# Patient Record
Sex: Male | Born: 1960 | Race: Black or African American | Hispanic: No | Marital: Single | Smoking: Never smoker
Health system: Southern US, Community
[De-identification: ages and names within clinical notes are randomized; demographics above are authoritative.]

## PROBLEM LIST (undated history)

## (undated) DIAGNOSIS — K219 Gastro-esophageal reflux disease without esophagitis: Secondary | ICD-10-CM

## (undated) DIAGNOSIS — C61 Malignant neoplasm of prostate: Secondary | ICD-10-CM

## (undated) DIAGNOSIS — E7800 Pure hypercholesterolemia, unspecified: Secondary | ICD-10-CM

## (undated) DIAGNOSIS — M1990 Unspecified osteoarthritis, unspecified site: Secondary | ICD-10-CM

## (undated) DIAGNOSIS — I10 Essential (primary) hypertension: Secondary | ICD-10-CM

## (undated) HISTORY — PX: NO PAST SURGERIES: SHX2092

---

## 2012-12-29 ENCOUNTER — Ambulatory Visit: Payer: Medicare PPO | Attending: Internal Medicine | Admitting: Physical Therapy

## 2012-12-29 DIAGNOSIS — M542 Cervicalgia: Secondary | ICD-10-CM | POA: Insufficient documentation

## 2012-12-29 DIAGNOSIS — IMO0001 Reserved for inherently not codable concepts without codable children: Secondary | ICD-10-CM | POA: Insufficient documentation

## 2012-12-29 DIAGNOSIS — M545 Low back pain, unspecified: Secondary | ICD-10-CM | POA: Insufficient documentation

## 2012-12-29 DIAGNOSIS — M6281 Muscle weakness (generalized): Secondary | ICD-10-CM | POA: Insufficient documentation

## 2013-01-05 ENCOUNTER — Ambulatory Visit: Payer: No Typology Code available for payment source | Attending: Internal Medicine | Admitting: Physical Therapy

## 2013-01-05 DIAGNOSIS — M545 Low back pain, unspecified: Secondary | ICD-10-CM | POA: Insufficient documentation

## 2013-01-05 DIAGNOSIS — M542 Cervicalgia: Secondary | ICD-10-CM | POA: Insufficient documentation

## 2013-01-05 DIAGNOSIS — IMO0001 Reserved for inherently not codable concepts without codable children: Secondary | ICD-10-CM | POA: Insufficient documentation

## 2013-01-05 DIAGNOSIS — M6281 Muscle weakness (generalized): Secondary | ICD-10-CM | POA: Insufficient documentation

## 2013-01-12 ENCOUNTER — Ambulatory Visit: Payer: No Typology Code available for payment source | Admitting: Rehabilitation

## 2013-01-16 ENCOUNTER — Ambulatory Visit: Payer: No Typology Code available for payment source | Admitting: Physical Therapy

## 2013-01-20 ENCOUNTER — Ambulatory Visit: Payer: No Typology Code available for payment source | Admitting: Rehabilitation

## 2013-01-23 ENCOUNTER — Ambulatory Visit: Payer: No Typology Code available for payment source | Admitting: Rehabilitation

## 2013-01-26 ENCOUNTER — Ambulatory Visit: Payer: No Typology Code available for payment source | Admitting: Physical Therapy

## 2013-01-31 ENCOUNTER — Ambulatory Visit: Payer: No Typology Code available for payment source | Attending: Internal Medicine | Admitting: Physical Therapy

## 2013-01-31 DIAGNOSIS — M545 Low back pain, unspecified: Secondary | ICD-10-CM | POA: Insufficient documentation

## 2013-01-31 DIAGNOSIS — IMO0001 Reserved for inherently not codable concepts without codable children: Secondary | ICD-10-CM | POA: Insufficient documentation

## 2013-01-31 DIAGNOSIS — M6281 Muscle weakness (generalized): Secondary | ICD-10-CM | POA: Diagnosis not present

## 2013-01-31 DIAGNOSIS — M542 Cervicalgia: Secondary | ICD-10-CM | POA: Insufficient documentation

## 2013-02-01 ENCOUNTER — Ambulatory Visit: Payer: No Typology Code available for payment source | Admitting: Physical Therapy

## 2013-02-03 ENCOUNTER — Ambulatory Visit: Payer: No Typology Code available for payment source | Admitting: Physical Therapy

## 2013-02-03 DIAGNOSIS — IMO0001 Reserved for inherently not codable concepts without codable children: Secondary | ICD-10-CM | POA: Diagnosis not present

## 2013-02-06 ENCOUNTER — Ambulatory Visit: Payer: No Typology Code available for payment source | Admitting: Rehabilitation

## 2013-02-06 ENCOUNTER — Ambulatory Visit: Payer: No Typology Code available for payment source | Admitting: Physical Therapy

## 2013-02-06 DIAGNOSIS — IMO0001 Reserved for inherently not codable concepts without codable children: Secondary | ICD-10-CM | POA: Diagnosis not present

## 2013-02-07 ENCOUNTER — Ambulatory Visit: Payer: No Typology Code available for payment source | Admitting: Physical Therapy

## 2013-02-07 DIAGNOSIS — IMO0001 Reserved for inherently not codable concepts without codable children: Secondary | ICD-10-CM | POA: Diagnosis not present

## 2013-02-09 ENCOUNTER — Encounter: Payer: 59 | Admitting: Rehabilitation

## 2015-06-10 DIAGNOSIS — M791 Myalgia: Secondary | ICD-10-CM | POA: Diagnosis not present

## 2015-06-10 DIAGNOSIS — G8929 Other chronic pain: Secondary | ICD-10-CM | POA: Diagnosis not present

## 2015-06-10 DIAGNOSIS — Z79899 Other long term (current) drug therapy: Secondary | ICD-10-CM | POA: Diagnosis not present

## 2015-06-10 DIAGNOSIS — M545 Low back pain: Secondary | ICD-10-CM | POA: Diagnosis not present

## 2015-06-30 DIAGNOSIS — M542 Cervicalgia: Secondary | ICD-10-CM | POA: Diagnosis not present

## 2015-07-08 DIAGNOSIS — M170 Bilateral primary osteoarthritis of knee: Secondary | ICD-10-CM | POA: Diagnosis not present

## 2015-07-08 DIAGNOSIS — J45909 Unspecified asthma, uncomplicated: Secondary | ICD-10-CM | POA: Diagnosis not present

## 2015-07-08 DIAGNOSIS — K219 Gastro-esophageal reflux disease without esophagitis: Secondary | ICD-10-CM | POA: Diagnosis not present

## 2015-07-08 DIAGNOSIS — E785 Hyperlipidemia, unspecified: Secondary | ICD-10-CM | POA: Diagnosis not present

## 2015-07-08 DIAGNOSIS — Z0100 Encounter for examination of eyes and vision without abnormal findings: Secondary | ICD-10-CM | POA: Diagnosis not present

## 2015-07-08 DIAGNOSIS — Z131 Encounter for screening for diabetes mellitus: Secondary | ICD-10-CM | POA: Diagnosis not present

## 2015-07-08 DIAGNOSIS — Z0000 Encounter for general adult medical examination without abnormal findings: Secondary | ICD-10-CM | POA: Diagnosis not present

## 2015-07-08 DIAGNOSIS — I10 Essential (primary) hypertension: Secondary | ICD-10-CM | POA: Diagnosis not present

## 2015-07-08 DIAGNOSIS — Z01118 Encounter for examination of ears and hearing with other abnormal findings: Secondary | ICD-10-CM | POA: Diagnosis not present

## 2015-07-10 DIAGNOSIS — G8929 Other chronic pain: Secondary | ICD-10-CM | POA: Diagnosis not present

## 2015-07-10 DIAGNOSIS — M545 Low back pain: Secondary | ICD-10-CM | POA: Diagnosis not present

## 2015-07-10 DIAGNOSIS — M791 Myalgia: Secondary | ICD-10-CM | POA: Diagnosis not present

## 2015-07-10 DIAGNOSIS — Z79899 Other long term (current) drug therapy: Secondary | ICD-10-CM | POA: Diagnosis not present

## 2015-07-30 DIAGNOSIS — M542 Cervicalgia: Secondary | ICD-10-CM | POA: Diagnosis not present

## 2015-08-06 DIAGNOSIS — M545 Low back pain: Secondary | ICD-10-CM | POA: Diagnosis not present

## 2015-08-06 DIAGNOSIS — M791 Myalgia: Secondary | ICD-10-CM | POA: Diagnosis not present

## 2015-08-06 DIAGNOSIS — Z79899 Other long term (current) drug therapy: Secondary | ICD-10-CM | POA: Diagnosis not present

## 2015-08-06 DIAGNOSIS — G8929 Other chronic pain: Secondary | ICD-10-CM | POA: Diagnosis not present

## 2015-08-07 DIAGNOSIS — R0989 Other specified symptoms and signs involving the circulatory and respiratory systems: Secondary | ICD-10-CM | POA: Diagnosis not present

## 2015-08-07 DIAGNOSIS — R200 Anesthesia of skin: Secondary | ICD-10-CM | POA: Diagnosis not present

## 2015-08-07 DIAGNOSIS — E785 Hyperlipidemia, unspecified: Secondary | ICD-10-CM | POA: Diagnosis not present

## 2015-08-07 DIAGNOSIS — M79609 Pain in unspecified limb: Secondary | ICD-10-CM | POA: Diagnosis not present

## 2015-08-19 DIAGNOSIS — J45909 Unspecified asthma, uncomplicated: Secondary | ICD-10-CM | POA: Diagnosis not present

## 2015-08-19 DIAGNOSIS — I10 Essential (primary) hypertension: Secondary | ICD-10-CM | POA: Diagnosis not present

## 2015-08-19 DIAGNOSIS — E559 Vitamin D deficiency, unspecified: Secondary | ICD-10-CM | POA: Diagnosis not present

## 2015-08-19 DIAGNOSIS — R7303 Prediabetes: Secondary | ICD-10-CM | POA: Diagnosis not present

## 2015-08-19 DIAGNOSIS — E785 Hyperlipidemia, unspecified: Secondary | ICD-10-CM | POA: Diagnosis not present

## 2015-08-19 DIAGNOSIS — K219 Gastro-esophageal reflux disease without esophagitis: Secondary | ICD-10-CM | POA: Diagnosis not present

## 2015-08-19 DIAGNOSIS — F419 Anxiety disorder, unspecified: Secondary | ICD-10-CM | POA: Diagnosis not present

## 2015-08-19 DIAGNOSIS — M170 Bilateral primary osteoarthritis of knee: Secondary | ICD-10-CM | POA: Diagnosis not present

## 2015-08-28 DIAGNOSIS — M542 Cervicalgia: Secondary | ICD-10-CM | POA: Diagnosis not present

## 2015-09-06 DIAGNOSIS — Z79899 Other long term (current) drug therapy: Secondary | ICD-10-CM | POA: Diagnosis not present

## 2015-09-06 DIAGNOSIS — M545 Low back pain: Secondary | ICD-10-CM | POA: Diagnosis not present

## 2015-09-06 DIAGNOSIS — M791 Myalgia: Secondary | ICD-10-CM | POA: Diagnosis not present

## 2015-09-06 DIAGNOSIS — G8929 Other chronic pain: Secondary | ICD-10-CM | POA: Diagnosis not present

## 2015-09-28 DIAGNOSIS — M542 Cervicalgia: Secondary | ICD-10-CM | POA: Diagnosis not present

## 2015-10-04 DIAGNOSIS — M791 Myalgia: Secondary | ICD-10-CM | POA: Diagnosis not present

## 2015-10-04 DIAGNOSIS — Z79899 Other long term (current) drug therapy: Secondary | ICD-10-CM | POA: Diagnosis not present

## 2015-10-04 DIAGNOSIS — G8929 Other chronic pain: Secondary | ICD-10-CM | POA: Diagnosis not present

## 2015-10-04 DIAGNOSIS — M545 Low back pain: Secondary | ICD-10-CM | POA: Diagnosis not present

## 2015-10-28 DIAGNOSIS — M542 Cervicalgia: Secondary | ICD-10-CM | POA: Diagnosis not present

## 2015-11-04 DIAGNOSIS — M791 Myalgia: Secondary | ICD-10-CM | POA: Diagnosis not present

## 2015-11-04 DIAGNOSIS — Z79899 Other long term (current) drug therapy: Secondary | ICD-10-CM | POA: Diagnosis not present

## 2015-11-04 DIAGNOSIS — M545 Low back pain: Secondary | ICD-10-CM | POA: Diagnosis not present

## 2015-11-04 DIAGNOSIS — G8929 Other chronic pain: Secondary | ICD-10-CM | POA: Diagnosis not present

## 2015-11-18 DIAGNOSIS — M170 Bilateral primary osteoarthritis of knee: Secondary | ICD-10-CM | POA: Diagnosis not present

## 2015-11-18 DIAGNOSIS — F419 Anxiety disorder, unspecified: Secondary | ICD-10-CM | POA: Diagnosis not present

## 2015-11-18 DIAGNOSIS — R7303 Prediabetes: Secondary | ICD-10-CM | POA: Diagnosis not present

## 2015-11-18 DIAGNOSIS — J45909 Unspecified asthma, uncomplicated: Secondary | ICD-10-CM | POA: Diagnosis not present

## 2015-11-18 DIAGNOSIS — E559 Vitamin D deficiency, unspecified: Secondary | ICD-10-CM | POA: Diagnosis not present

## 2015-11-18 DIAGNOSIS — I10 Essential (primary) hypertension: Secondary | ICD-10-CM | POA: Diagnosis not present

## 2015-11-18 DIAGNOSIS — E785 Hyperlipidemia, unspecified: Secondary | ICD-10-CM | POA: Diagnosis not present

## 2015-11-18 DIAGNOSIS — K219 Gastro-esophageal reflux disease without esophagitis: Secondary | ICD-10-CM | POA: Diagnosis not present

## 2015-11-28 DIAGNOSIS — M542 Cervicalgia: Secondary | ICD-10-CM | POA: Diagnosis not present

## 2015-12-04 DIAGNOSIS — Z79899 Other long term (current) drug therapy: Secondary | ICD-10-CM | POA: Diagnosis not present

## 2015-12-04 DIAGNOSIS — G8929 Other chronic pain: Secondary | ICD-10-CM | POA: Diagnosis not present

## 2015-12-04 DIAGNOSIS — M791 Myalgia: Secondary | ICD-10-CM | POA: Diagnosis not present

## 2015-12-04 DIAGNOSIS — M545 Low back pain: Secondary | ICD-10-CM | POA: Diagnosis not present

## 2015-12-16 DIAGNOSIS — I10 Essential (primary) hypertension: Secondary | ICD-10-CM | POA: Diagnosis not present

## 2015-12-16 DIAGNOSIS — K219 Gastro-esophageal reflux disease without esophagitis: Secondary | ICD-10-CM | POA: Diagnosis not present

## 2015-12-16 DIAGNOSIS — F419 Anxiety disorder, unspecified: Secondary | ICD-10-CM | POA: Diagnosis not present

## 2015-12-16 DIAGNOSIS — E559 Vitamin D deficiency, unspecified: Secondary | ICD-10-CM | POA: Diagnosis not present

## 2015-12-16 DIAGNOSIS — R7303 Prediabetes: Secondary | ICD-10-CM | POA: Diagnosis not present

## 2015-12-16 DIAGNOSIS — J45909 Unspecified asthma, uncomplicated: Secondary | ICD-10-CM | POA: Diagnosis not present

## 2015-12-16 DIAGNOSIS — M170 Bilateral primary osteoarthritis of knee: Secondary | ICD-10-CM | POA: Diagnosis not present

## 2015-12-16 DIAGNOSIS — E785 Hyperlipidemia, unspecified: Secondary | ICD-10-CM | POA: Diagnosis not present

## 2015-12-28 DIAGNOSIS — M542 Cervicalgia: Secondary | ICD-10-CM | POA: Diagnosis not present

## 2016-01-02 DIAGNOSIS — G8929 Other chronic pain: Secondary | ICD-10-CM | POA: Diagnosis not present

## 2016-01-02 DIAGNOSIS — M545 Low back pain: Secondary | ICD-10-CM | POA: Diagnosis not present

## 2016-01-02 DIAGNOSIS — M791 Myalgia: Secondary | ICD-10-CM | POA: Diagnosis not present

## 2016-01-02 DIAGNOSIS — Z79899 Other long term (current) drug therapy: Secondary | ICD-10-CM | POA: Diagnosis not present

## 2016-01-28 DIAGNOSIS — M542 Cervicalgia: Secondary | ICD-10-CM | POA: Diagnosis not present

## 2016-01-31 DIAGNOSIS — M791 Myalgia: Secondary | ICD-10-CM | POA: Diagnosis not present

## 2016-01-31 DIAGNOSIS — G8929 Other chronic pain: Secondary | ICD-10-CM | POA: Diagnosis not present

## 2016-01-31 DIAGNOSIS — M545 Low back pain: Secondary | ICD-10-CM | POA: Diagnosis not present

## 2016-01-31 DIAGNOSIS — Z79899 Other long term (current) drug therapy: Secondary | ICD-10-CM | POA: Diagnosis not present

## 2016-02-17 DIAGNOSIS — E785 Hyperlipidemia, unspecified: Secondary | ICD-10-CM | POA: Diagnosis not present

## 2016-02-17 DIAGNOSIS — Z131 Encounter for screening for diabetes mellitus: Secondary | ICD-10-CM | POA: Diagnosis not present

## 2016-02-17 DIAGNOSIS — R7303 Prediabetes: Secondary | ICD-10-CM | POA: Diagnosis not present

## 2016-02-17 DIAGNOSIS — Z5181 Encounter for therapeutic drug level monitoring: Secondary | ICD-10-CM | POA: Diagnosis not present

## 2016-02-17 DIAGNOSIS — K219 Gastro-esophageal reflux disease without esophagitis: Secondary | ICD-10-CM | POA: Diagnosis not present

## 2016-02-17 DIAGNOSIS — I10 Essential (primary) hypertension: Secondary | ICD-10-CM | POA: Diagnosis not present

## 2016-02-17 DIAGNOSIS — M170 Bilateral primary osteoarthritis of knee: Secondary | ICD-10-CM | POA: Diagnosis not present

## 2016-02-17 DIAGNOSIS — J45909 Unspecified asthma, uncomplicated: Secondary | ICD-10-CM | POA: Diagnosis not present

## 2016-02-17 DIAGNOSIS — Z0001 Encounter for general adult medical examination with abnormal findings: Secondary | ICD-10-CM | POA: Diagnosis not present

## 2016-02-28 DIAGNOSIS — M542 Cervicalgia: Secondary | ICD-10-CM | POA: Diagnosis not present

## 2016-03-02 DIAGNOSIS — G8929 Other chronic pain: Secondary | ICD-10-CM | POA: Diagnosis not present

## 2016-03-02 DIAGNOSIS — Z79899 Other long term (current) drug therapy: Secondary | ICD-10-CM | POA: Diagnosis not present

## 2016-03-02 DIAGNOSIS — M791 Myalgia: Secondary | ICD-10-CM | POA: Diagnosis not present

## 2016-03-29 DIAGNOSIS — M542 Cervicalgia: Secondary | ICD-10-CM | POA: Diagnosis not present

## 2016-03-31 DIAGNOSIS — K219 Gastro-esophageal reflux disease without esophagitis: Secondary | ICD-10-CM | POA: Diagnosis not present

## 2016-03-31 DIAGNOSIS — J45909 Unspecified asthma, uncomplicated: Secondary | ICD-10-CM | POA: Diagnosis not present

## 2016-03-31 DIAGNOSIS — F419 Anxiety disorder, unspecified: Secondary | ICD-10-CM | POA: Diagnosis not present

## 2016-03-31 DIAGNOSIS — M170 Bilateral primary osteoarthritis of knee: Secondary | ICD-10-CM | POA: Diagnosis not present

## 2016-03-31 DIAGNOSIS — G8929 Other chronic pain: Secondary | ICD-10-CM | POA: Diagnosis not present

## 2016-03-31 DIAGNOSIS — E559 Vitamin D deficiency, unspecified: Secondary | ICD-10-CM | POA: Diagnosis not present

## 2016-03-31 DIAGNOSIS — R7303 Prediabetes: Secondary | ICD-10-CM | POA: Diagnosis not present

## 2016-03-31 DIAGNOSIS — Z79899 Other long term (current) drug therapy: Secondary | ICD-10-CM | POA: Diagnosis not present

## 2016-03-31 DIAGNOSIS — I10 Essential (primary) hypertension: Secondary | ICD-10-CM | POA: Diagnosis not present

## 2016-03-31 DIAGNOSIS — E785 Hyperlipidemia, unspecified: Secondary | ICD-10-CM | POA: Diagnosis not present

## 2016-03-31 DIAGNOSIS — R05 Cough: Secondary | ICD-10-CM | POA: Diagnosis not present

## 2016-03-31 DIAGNOSIS — M791 Myalgia: Secondary | ICD-10-CM | POA: Diagnosis not present

## 2016-04-28 DIAGNOSIS — F419 Anxiety disorder, unspecified: Secondary | ICD-10-CM | POA: Diagnosis not present

## 2016-04-28 DIAGNOSIS — E559 Vitamin D deficiency, unspecified: Secondary | ICD-10-CM | POA: Diagnosis not present

## 2016-04-28 DIAGNOSIS — R7303 Prediabetes: Secondary | ICD-10-CM | POA: Diagnosis not present

## 2016-04-28 DIAGNOSIS — I10 Essential (primary) hypertension: Secondary | ICD-10-CM | POA: Diagnosis not present

## 2016-04-28 DIAGNOSIS — K219 Gastro-esophageal reflux disease without esophagitis: Secondary | ICD-10-CM | POA: Diagnosis not present

## 2016-04-28 DIAGNOSIS — E785 Hyperlipidemia, unspecified: Secondary | ICD-10-CM | POA: Diagnosis not present

## 2016-04-28 DIAGNOSIS — M170 Bilateral primary osteoarthritis of knee: Secondary | ICD-10-CM | POA: Diagnosis not present

## 2016-04-28 DIAGNOSIS — R739 Hyperglycemia, unspecified: Secondary | ICD-10-CM | POA: Diagnosis not present

## 2016-04-28 DIAGNOSIS — J45909 Unspecified asthma, uncomplicated: Secondary | ICD-10-CM | POA: Diagnosis not present

## 2016-04-29 DIAGNOSIS — M542 Cervicalgia: Secondary | ICD-10-CM | POA: Diagnosis not present

## 2016-04-30 DIAGNOSIS — G8929 Other chronic pain: Secondary | ICD-10-CM | POA: Diagnosis not present

## 2016-04-30 DIAGNOSIS — Z79899 Other long term (current) drug therapy: Secondary | ICD-10-CM | POA: Diagnosis not present

## 2016-04-30 DIAGNOSIS — M791 Myalgia: Secondary | ICD-10-CM | POA: Diagnosis not present

## 2016-05-20 DIAGNOSIS — Z79899 Other long term (current) drug therapy: Secondary | ICD-10-CM | POA: Diagnosis not present

## 2016-05-20 DIAGNOSIS — G8929 Other chronic pain: Secondary | ICD-10-CM | POA: Diagnosis not present

## 2016-05-20 DIAGNOSIS — M791 Myalgia: Secondary | ICD-10-CM | POA: Diagnosis not present

## 2016-05-29 DIAGNOSIS — M542 Cervicalgia: Secondary | ICD-10-CM | POA: Diagnosis not present

## 2016-06-09 DIAGNOSIS — K219 Gastro-esophageal reflux disease without esophagitis: Secondary | ICD-10-CM | POA: Diagnosis not present

## 2016-06-09 DIAGNOSIS — R7303 Prediabetes: Secondary | ICD-10-CM | POA: Diagnosis not present

## 2016-06-09 DIAGNOSIS — E559 Vitamin D deficiency, unspecified: Secondary | ICD-10-CM | POA: Diagnosis not present

## 2016-06-09 DIAGNOSIS — F419 Anxiety disorder, unspecified: Secondary | ICD-10-CM | POA: Diagnosis not present

## 2016-06-09 DIAGNOSIS — I10 Essential (primary) hypertension: Secondary | ICD-10-CM | POA: Diagnosis not present

## 2016-06-09 DIAGNOSIS — E785 Hyperlipidemia, unspecified: Secondary | ICD-10-CM | POA: Diagnosis not present

## 2016-06-09 DIAGNOSIS — J45909 Unspecified asthma, uncomplicated: Secondary | ICD-10-CM | POA: Diagnosis not present

## 2016-06-09 DIAGNOSIS — M170 Bilateral primary osteoarthritis of knee: Secondary | ICD-10-CM | POA: Diagnosis not present

## 2016-06-26 DIAGNOSIS — G8929 Other chronic pain: Secondary | ICD-10-CM | POA: Diagnosis not present

## 2016-06-26 DIAGNOSIS — M791 Myalgia: Secondary | ICD-10-CM | POA: Diagnosis not present

## 2016-06-26 DIAGNOSIS — Z79899 Other long term (current) drug therapy: Secondary | ICD-10-CM | POA: Diagnosis not present

## 2016-06-29 DIAGNOSIS — M542 Cervicalgia: Secondary | ICD-10-CM | POA: Diagnosis not present

## 2016-07-21 DIAGNOSIS — Z0000 Encounter for general adult medical examination without abnormal findings: Secondary | ICD-10-CM | POA: Diagnosis not present

## 2016-07-21 DIAGNOSIS — E785 Hyperlipidemia, unspecified: Secondary | ICD-10-CM | POA: Diagnosis not present

## 2016-07-21 DIAGNOSIS — J45909 Unspecified asthma, uncomplicated: Secondary | ICD-10-CM | POA: Diagnosis not present

## 2016-07-21 DIAGNOSIS — Z01118 Encounter for examination of ears and hearing with other abnormal findings: Secondary | ICD-10-CM | POA: Diagnosis not present

## 2016-07-21 DIAGNOSIS — Z131 Encounter for screening for diabetes mellitus: Secondary | ICD-10-CM | POA: Diagnosis not present

## 2016-07-21 DIAGNOSIS — Z125 Encounter for screening for malignant neoplasm of prostate: Secondary | ICD-10-CM | POA: Diagnosis not present

## 2016-07-21 DIAGNOSIS — K219 Gastro-esophageal reflux disease without esophagitis: Secondary | ICD-10-CM | POA: Diagnosis not present

## 2016-07-21 DIAGNOSIS — H538 Other visual disturbances: Secondary | ICD-10-CM | POA: Diagnosis not present

## 2016-07-21 DIAGNOSIS — Z136 Encounter for screening for cardiovascular disorders: Secondary | ICD-10-CM | POA: Diagnosis not present

## 2016-07-21 DIAGNOSIS — I10 Essential (primary) hypertension: Secondary | ICD-10-CM | POA: Diagnosis not present

## 2016-07-24 DIAGNOSIS — I10 Essential (primary) hypertension: Secondary | ICD-10-CM | POA: Diagnosis not present

## 2016-07-24 DIAGNOSIS — J45909 Unspecified asthma, uncomplicated: Secondary | ICD-10-CM | POA: Diagnosis not present

## 2016-07-24 DIAGNOSIS — G8929 Other chronic pain: Secondary | ICD-10-CM | POA: Diagnosis not present

## 2016-07-24 DIAGNOSIS — K219 Gastro-esophageal reflux disease without esophagitis: Secondary | ICD-10-CM | POA: Diagnosis not present

## 2016-07-24 DIAGNOSIS — M791 Myalgia: Secondary | ICD-10-CM | POA: Diagnosis not present

## 2016-07-24 DIAGNOSIS — F419 Anxiety disorder, unspecified: Secondary | ICD-10-CM | POA: Diagnosis not present

## 2016-07-24 DIAGNOSIS — J329 Chronic sinusitis, unspecified: Secondary | ICD-10-CM | POA: Diagnosis not present

## 2016-07-24 DIAGNOSIS — M170 Bilateral primary osteoarthritis of knee: Secondary | ICD-10-CM | POA: Diagnosis not present

## 2016-07-24 DIAGNOSIS — Z79899 Other long term (current) drug therapy: Secondary | ICD-10-CM | POA: Diagnosis not present

## 2016-07-24 DIAGNOSIS — E785 Hyperlipidemia, unspecified: Secondary | ICD-10-CM | POA: Diagnosis not present

## 2016-07-29 DIAGNOSIS — M542 Cervicalgia: Secondary | ICD-10-CM | POA: Diagnosis not present

## 2016-08-04 DIAGNOSIS — M170 Bilateral primary osteoarthritis of knee: Secondary | ICD-10-CM | POA: Diagnosis not present

## 2016-08-04 DIAGNOSIS — K219 Gastro-esophageal reflux disease without esophagitis: Secondary | ICD-10-CM | POA: Diagnosis not present

## 2016-08-04 DIAGNOSIS — I10 Essential (primary) hypertension: Secondary | ICD-10-CM | POA: Diagnosis not present

## 2016-08-04 DIAGNOSIS — F419 Anxiety disorder, unspecified: Secondary | ICD-10-CM | POA: Diagnosis not present

## 2016-08-04 DIAGNOSIS — J45909 Unspecified asthma, uncomplicated: Secondary | ICD-10-CM | POA: Diagnosis not present

## 2016-08-04 DIAGNOSIS — J302 Other seasonal allergic rhinitis: Secondary | ICD-10-CM | POA: Diagnosis not present

## 2016-08-04 DIAGNOSIS — E785 Hyperlipidemia, unspecified: Secondary | ICD-10-CM | POA: Diagnosis not present

## 2016-08-21 DIAGNOSIS — G8929 Other chronic pain: Secondary | ICD-10-CM | POA: Diagnosis not present

## 2016-08-21 DIAGNOSIS — M791 Myalgia: Secondary | ICD-10-CM | POA: Diagnosis not present

## 2016-08-21 DIAGNOSIS — Z79899 Other long term (current) drug therapy: Secondary | ICD-10-CM | POA: Diagnosis not present

## 2016-08-27 DIAGNOSIS — M542 Cervicalgia: Secondary | ICD-10-CM | POA: Diagnosis not present

## 2016-09-22 DIAGNOSIS — Z79899 Other long term (current) drug therapy: Secondary | ICD-10-CM | POA: Diagnosis not present

## 2016-09-22 DIAGNOSIS — G8929 Other chronic pain: Secondary | ICD-10-CM | POA: Diagnosis not present

## 2016-09-22 DIAGNOSIS — M791 Myalgia: Secondary | ICD-10-CM | POA: Diagnosis not present

## 2016-09-27 DIAGNOSIS — M542 Cervicalgia: Secondary | ICD-10-CM | POA: Diagnosis not present

## 2016-10-22 DIAGNOSIS — G8929 Other chronic pain: Secondary | ICD-10-CM | POA: Diagnosis not present

## 2016-10-22 DIAGNOSIS — M791 Myalgia: Secondary | ICD-10-CM | POA: Diagnosis not present

## 2016-10-22 DIAGNOSIS — Z79899 Other long term (current) drug therapy: Secondary | ICD-10-CM | POA: Diagnosis not present

## 2016-10-27 DIAGNOSIS — M542 Cervicalgia: Secondary | ICD-10-CM | POA: Diagnosis not present

## 2016-11-03 DIAGNOSIS — M170 Bilateral primary osteoarthritis of knee: Secondary | ICD-10-CM | POA: Diagnosis not present

## 2016-11-03 DIAGNOSIS — J302 Other seasonal allergic rhinitis: Secondary | ICD-10-CM | POA: Diagnosis not present

## 2016-11-03 DIAGNOSIS — E663 Overweight: Secondary | ICD-10-CM | POA: Diagnosis not present

## 2016-11-03 DIAGNOSIS — J45909 Unspecified asthma, uncomplicated: Secondary | ICD-10-CM | POA: Diagnosis not present

## 2016-11-03 DIAGNOSIS — K219 Gastro-esophageal reflux disease without esophagitis: Secondary | ICD-10-CM | POA: Diagnosis not present

## 2016-11-03 DIAGNOSIS — I10 Essential (primary) hypertension: Secondary | ICD-10-CM | POA: Diagnosis not present

## 2016-11-03 DIAGNOSIS — F419 Anxiety disorder, unspecified: Secondary | ICD-10-CM | POA: Diagnosis not present

## 2016-11-03 DIAGNOSIS — E785 Hyperlipidemia, unspecified: Secondary | ICD-10-CM | POA: Diagnosis not present

## 2016-11-19 DIAGNOSIS — M791 Myalgia: Secondary | ICD-10-CM | POA: Diagnosis not present

## 2016-11-19 DIAGNOSIS — G8929 Other chronic pain: Secondary | ICD-10-CM | POA: Diagnosis not present

## 2016-11-19 DIAGNOSIS — Z79899 Other long term (current) drug therapy: Secondary | ICD-10-CM | POA: Diagnosis not present

## 2016-11-27 DIAGNOSIS — M542 Cervicalgia: Secondary | ICD-10-CM | POA: Diagnosis not present

## 2016-12-18 DIAGNOSIS — Z79899 Other long term (current) drug therapy: Secondary | ICD-10-CM | POA: Diagnosis not present

## 2016-12-18 DIAGNOSIS — G8929 Other chronic pain: Secondary | ICD-10-CM | POA: Diagnosis not present

## 2016-12-18 DIAGNOSIS — M791 Myalgia: Secondary | ICD-10-CM | POA: Diagnosis not present

## 2016-12-27 DIAGNOSIS — M542 Cervicalgia: Secondary | ICD-10-CM | POA: Diagnosis not present

## 2017-01-15 DIAGNOSIS — M545 Low back pain: Secondary | ICD-10-CM | POA: Diagnosis not present

## 2017-01-15 DIAGNOSIS — M791 Myalgia: Secondary | ICD-10-CM | POA: Diagnosis not present

## 2017-01-15 DIAGNOSIS — Z79899 Other long term (current) drug therapy: Secondary | ICD-10-CM | POA: Diagnosis not present

## 2017-01-15 DIAGNOSIS — G8929 Other chronic pain: Secondary | ICD-10-CM | POA: Diagnosis not present

## 2017-01-27 DIAGNOSIS — M542 Cervicalgia: Secondary | ICD-10-CM | POA: Diagnosis not present

## 2017-02-02 DIAGNOSIS — F419 Anxiety disorder, unspecified: Secondary | ICD-10-CM | POA: Diagnosis not present

## 2017-02-02 DIAGNOSIS — K219 Gastro-esophageal reflux disease without esophagitis: Secondary | ICD-10-CM | POA: Diagnosis not present

## 2017-02-02 DIAGNOSIS — J45909 Unspecified asthma, uncomplicated: Secondary | ICD-10-CM | POA: Diagnosis not present

## 2017-02-02 DIAGNOSIS — J302 Other seasonal allergic rhinitis: Secondary | ICD-10-CM | POA: Diagnosis not present

## 2017-02-02 DIAGNOSIS — M170 Bilateral primary osteoarthritis of knee: Secondary | ICD-10-CM | POA: Diagnosis not present

## 2017-02-02 DIAGNOSIS — E663 Overweight: Secondary | ICD-10-CM | POA: Diagnosis not present

## 2017-02-02 DIAGNOSIS — E785 Hyperlipidemia, unspecified: Secondary | ICD-10-CM | POA: Diagnosis not present

## 2017-02-02 DIAGNOSIS — I10 Essential (primary) hypertension: Secondary | ICD-10-CM | POA: Diagnosis not present

## 2017-02-15 DIAGNOSIS — M791 Myalgia: Secondary | ICD-10-CM | POA: Diagnosis not present

## 2017-02-15 DIAGNOSIS — G8929 Other chronic pain: Secondary | ICD-10-CM | POA: Diagnosis not present

## 2017-02-15 DIAGNOSIS — Z79899 Other long term (current) drug therapy: Secondary | ICD-10-CM | POA: Diagnosis not present

## 2017-02-18 DIAGNOSIS — Z0000 Encounter for general adult medical examination without abnormal findings: Secondary | ICD-10-CM | POA: Diagnosis not present

## 2017-02-18 DIAGNOSIS — K219 Gastro-esophageal reflux disease without esophagitis: Secondary | ICD-10-CM | POA: Diagnosis not present

## 2017-02-18 DIAGNOSIS — E785 Hyperlipidemia, unspecified: Secondary | ICD-10-CM | POA: Diagnosis not present

## 2017-02-18 DIAGNOSIS — E663 Overweight: Secondary | ICD-10-CM | POA: Diagnosis not present

## 2017-02-18 DIAGNOSIS — J302 Other seasonal allergic rhinitis: Secondary | ICD-10-CM | POA: Diagnosis not present

## 2017-02-18 DIAGNOSIS — I10 Essential (primary) hypertension: Secondary | ICD-10-CM | POA: Diagnosis not present

## 2017-02-18 DIAGNOSIS — M170 Bilateral primary osteoarthritis of knee: Secondary | ICD-10-CM | POA: Diagnosis not present

## 2017-02-18 DIAGNOSIS — J45909 Unspecified asthma, uncomplicated: Secondary | ICD-10-CM | POA: Diagnosis not present

## 2017-02-18 DIAGNOSIS — F419 Anxiety disorder, unspecified: Secondary | ICD-10-CM | POA: Diagnosis not present

## 2017-02-27 DIAGNOSIS — M542 Cervicalgia: Secondary | ICD-10-CM | POA: Diagnosis not present

## 2017-03-17 DIAGNOSIS — M7918 Myalgia, other site: Secondary | ICD-10-CM | POA: Diagnosis not present

## 2017-03-17 DIAGNOSIS — Z79899 Other long term (current) drug therapy: Secondary | ICD-10-CM | POA: Diagnosis not present

## 2017-03-17 DIAGNOSIS — G8929 Other chronic pain: Secondary | ICD-10-CM | POA: Diagnosis not present

## 2017-03-30 DIAGNOSIS — J302 Other seasonal allergic rhinitis: Secondary | ICD-10-CM | POA: Diagnosis not present

## 2017-03-30 DIAGNOSIS — E785 Hyperlipidemia, unspecified: Secondary | ICD-10-CM | POA: Diagnosis not present

## 2017-03-30 DIAGNOSIS — I10 Essential (primary) hypertension: Secondary | ICD-10-CM | POA: Diagnosis not present

## 2017-03-30 DIAGNOSIS — Z23 Encounter for immunization: Secondary | ICD-10-CM | POA: Diagnosis not present

## 2017-03-30 DIAGNOSIS — E663 Overweight: Secondary | ICD-10-CM | POA: Diagnosis not present

## 2017-03-30 DIAGNOSIS — M170 Bilateral primary osteoarthritis of knee: Secondary | ICD-10-CM | POA: Diagnosis not present

## 2017-03-30 DIAGNOSIS — J45909 Unspecified asthma, uncomplicated: Secondary | ICD-10-CM | POA: Diagnosis not present

## 2017-03-30 DIAGNOSIS — K219 Gastro-esophageal reflux disease without esophagitis: Secondary | ICD-10-CM | POA: Diagnosis not present

## 2017-03-30 DIAGNOSIS — F419 Anxiety disorder, unspecified: Secondary | ICD-10-CM | POA: Diagnosis not present

## 2017-04-16 DIAGNOSIS — Z79899 Other long term (current) drug therapy: Secondary | ICD-10-CM | POA: Diagnosis not present

## 2017-04-16 DIAGNOSIS — M7918 Myalgia, other site: Secondary | ICD-10-CM | POA: Diagnosis not present

## 2017-04-16 DIAGNOSIS — G8929 Other chronic pain: Secondary | ICD-10-CM | POA: Diagnosis not present

## 2017-05-14 DIAGNOSIS — G8929 Other chronic pain: Secondary | ICD-10-CM | POA: Diagnosis not present

## 2017-05-14 DIAGNOSIS — M7918 Myalgia, other site: Secondary | ICD-10-CM | POA: Diagnosis not present

## 2017-05-14 DIAGNOSIS — Z79899 Other long term (current) drug therapy: Secondary | ICD-10-CM | POA: Diagnosis not present

## 2017-05-18 DIAGNOSIS — M170 Bilateral primary osteoarthritis of knee: Secondary | ICD-10-CM | POA: Diagnosis not present

## 2017-05-18 DIAGNOSIS — F419 Anxiety disorder, unspecified: Secondary | ICD-10-CM | POA: Diagnosis not present

## 2017-05-18 DIAGNOSIS — I10 Essential (primary) hypertension: Secondary | ICD-10-CM | POA: Diagnosis not present

## 2017-05-18 DIAGNOSIS — E663 Overweight: Secondary | ICD-10-CM | POA: Diagnosis not present

## 2017-05-18 DIAGNOSIS — J302 Other seasonal allergic rhinitis: Secondary | ICD-10-CM | POA: Diagnosis not present

## 2017-05-18 DIAGNOSIS — K219 Gastro-esophageal reflux disease without esophagitis: Secondary | ICD-10-CM | POA: Diagnosis not present

## 2017-05-18 DIAGNOSIS — E785 Hyperlipidemia, unspecified: Secondary | ICD-10-CM | POA: Diagnosis not present

## 2017-05-18 DIAGNOSIS — J45909 Unspecified asthma, uncomplicated: Secondary | ICD-10-CM | POA: Diagnosis not present

## 2017-05-18 DIAGNOSIS — J069 Acute upper respiratory infection, unspecified: Secondary | ICD-10-CM | POA: Diagnosis not present

## 2017-06-11 DIAGNOSIS — M7918 Myalgia, other site: Secondary | ICD-10-CM | POA: Diagnosis not present

## 2017-06-11 DIAGNOSIS — G8929 Other chronic pain: Secondary | ICD-10-CM | POA: Diagnosis not present

## 2017-06-11 DIAGNOSIS — Z79899 Other long term (current) drug therapy: Secondary | ICD-10-CM | POA: Diagnosis not present

## 2017-07-12 DIAGNOSIS — Z79899 Other long term (current) drug therapy: Secondary | ICD-10-CM | POA: Diagnosis not present

## 2017-07-12 DIAGNOSIS — G8929 Other chronic pain: Secondary | ICD-10-CM | POA: Diagnosis not present

## 2017-07-12 DIAGNOSIS — M545 Low back pain: Secondary | ICD-10-CM | POA: Diagnosis not present

## 2017-07-12 DIAGNOSIS — I10 Essential (primary) hypertension: Secondary | ICD-10-CM | POA: Diagnosis not present

## 2017-07-12 DIAGNOSIS — M7918 Myalgia, other site: Secondary | ICD-10-CM | POA: Diagnosis not present

## 2017-07-27 DIAGNOSIS — G8929 Other chronic pain: Secondary | ICD-10-CM | POA: Diagnosis not present

## 2017-07-27 DIAGNOSIS — Z79899 Other long term (current) drug therapy: Secondary | ICD-10-CM | POA: Diagnosis not present

## 2017-07-27 DIAGNOSIS — M7918 Myalgia, other site: Secondary | ICD-10-CM | POA: Diagnosis not present

## 2017-08-17 DIAGNOSIS — F419 Anxiety disorder, unspecified: Secondary | ICD-10-CM | POA: Diagnosis not present

## 2017-08-17 DIAGNOSIS — J45909 Unspecified asthma, uncomplicated: Secondary | ICD-10-CM | POA: Diagnosis not present

## 2017-08-17 DIAGNOSIS — Z136 Encounter for screening for cardiovascular disorders: Secondary | ICD-10-CM | POA: Diagnosis not present

## 2017-08-17 DIAGNOSIS — M170 Bilateral primary osteoarthritis of knee: Secondary | ICD-10-CM | POA: Diagnosis not present

## 2017-08-17 DIAGNOSIS — Z0000 Encounter for general adult medical examination without abnormal findings: Secondary | ICD-10-CM | POA: Diagnosis not present

## 2017-08-17 DIAGNOSIS — K219 Gastro-esophageal reflux disease without esophagitis: Secondary | ICD-10-CM | POA: Diagnosis not present

## 2017-08-17 DIAGNOSIS — Z0110 Encounter for examination of ears and hearing without abnormal findings: Secondary | ICD-10-CM | POA: Diagnosis not present

## 2017-08-17 DIAGNOSIS — Z0100 Encounter for examination of eyes and vision without abnormal findings: Secondary | ICD-10-CM | POA: Diagnosis not present

## 2017-08-17 DIAGNOSIS — E785 Hyperlipidemia, unspecified: Secondary | ICD-10-CM | POA: Diagnosis not present

## 2017-08-17 DIAGNOSIS — I10 Essential (primary) hypertension: Secondary | ICD-10-CM | POA: Diagnosis not present

## 2017-08-17 DIAGNOSIS — Z125 Encounter for screening for malignant neoplasm of prostate: Secondary | ICD-10-CM | POA: Diagnosis not present

## 2017-08-30 DIAGNOSIS — M7918 Myalgia, other site: Secondary | ICD-10-CM | POA: Diagnosis not present

## 2017-08-30 DIAGNOSIS — Z79899 Other long term (current) drug therapy: Secondary | ICD-10-CM | POA: Diagnosis not present

## 2017-08-30 DIAGNOSIS — G8929 Other chronic pain: Secondary | ICD-10-CM | POA: Diagnosis not present

## 2017-09-24 DIAGNOSIS — Z0100 Encounter for examination of eyes and vision without abnormal findings: Secondary | ICD-10-CM | POA: Diagnosis not present

## 2017-09-24 DIAGNOSIS — H5203 Hypermetropia, bilateral: Secondary | ICD-10-CM | POA: Diagnosis not present

## 2017-09-29 DIAGNOSIS — Z79899 Other long term (current) drug therapy: Secondary | ICD-10-CM | POA: Diagnosis not present

## 2017-09-29 DIAGNOSIS — G8929 Other chronic pain: Secondary | ICD-10-CM | POA: Diagnosis not present

## 2017-09-29 DIAGNOSIS — M7918 Myalgia, other site: Secondary | ICD-10-CM | POA: Diagnosis not present

## 2017-11-02 DIAGNOSIS — Z79899 Other long term (current) drug therapy: Secondary | ICD-10-CM | POA: Diagnosis not present

## 2017-11-02 DIAGNOSIS — G8929 Other chronic pain: Secondary | ICD-10-CM | POA: Diagnosis not present

## 2017-11-02 DIAGNOSIS — Z79891 Long term (current) use of opiate analgesic: Secondary | ICD-10-CM | POA: Diagnosis not present

## 2017-11-02 DIAGNOSIS — M7918 Myalgia, other site: Secondary | ICD-10-CM | POA: Diagnosis not present

## 2017-11-16 DIAGNOSIS — E785 Hyperlipidemia, unspecified: Secondary | ICD-10-CM | POA: Diagnosis not present

## 2017-11-18 DIAGNOSIS — I10 Essential (primary) hypertension: Secondary | ICD-10-CM | POA: Diagnosis not present

## 2017-11-18 DIAGNOSIS — J45909 Unspecified asthma, uncomplicated: Secondary | ICD-10-CM | POA: Diagnosis not present

## 2017-11-18 DIAGNOSIS — Z131 Encounter for screening for diabetes mellitus: Secondary | ICD-10-CM | POA: Diagnosis not present

## 2017-11-18 DIAGNOSIS — K219 Gastro-esophageal reflux disease without esophagitis: Secondary | ICD-10-CM | POA: Diagnosis not present

## 2017-11-18 DIAGNOSIS — E663 Overweight: Secondary | ICD-10-CM | POA: Diagnosis not present

## 2017-11-18 DIAGNOSIS — E785 Hyperlipidemia, unspecified: Secondary | ICD-10-CM | POA: Diagnosis not present

## 2017-11-18 DIAGNOSIS — M170 Bilateral primary osteoarthritis of knee: Secondary | ICD-10-CM | POA: Diagnosis not present

## 2017-11-18 DIAGNOSIS — F419 Anxiety disorder, unspecified: Secondary | ICD-10-CM | POA: Diagnosis not present

## 2017-11-30 DIAGNOSIS — G8929 Other chronic pain: Secondary | ICD-10-CM | POA: Diagnosis not present

## 2017-11-30 DIAGNOSIS — M7918 Myalgia, other site: Secondary | ICD-10-CM | POA: Diagnosis not present

## 2017-11-30 DIAGNOSIS — Z79891 Long term (current) use of opiate analgesic: Secondary | ICD-10-CM | POA: Diagnosis not present

## 2017-11-30 DIAGNOSIS — Z79899 Other long term (current) drug therapy: Secondary | ICD-10-CM | POA: Diagnosis not present

## 2017-12-23 DIAGNOSIS — Z1211 Encounter for screening for malignant neoplasm of colon: Secondary | ICD-10-CM | POA: Insufficient documentation

## 2017-12-30 DIAGNOSIS — Z1212 Encounter for screening for malignant neoplasm of rectum: Secondary | ICD-10-CM | POA: Diagnosis not present

## 2017-12-30 DIAGNOSIS — Z79899 Other long term (current) drug therapy: Secondary | ICD-10-CM | POA: Diagnosis not present

## 2017-12-30 DIAGNOSIS — G8929 Other chronic pain: Secondary | ICD-10-CM | POA: Diagnosis not present

## 2017-12-30 DIAGNOSIS — Z79891 Long term (current) use of opiate analgesic: Secondary | ICD-10-CM | POA: Diagnosis not present

## 2017-12-30 DIAGNOSIS — M7918 Myalgia, other site: Secondary | ICD-10-CM | POA: Diagnosis not present

## 2017-12-30 DIAGNOSIS — Z1211 Encounter for screening for malignant neoplasm of colon: Secondary | ICD-10-CM | POA: Diagnosis not present

## 2018-01-20 DIAGNOSIS — Z79891 Long term (current) use of opiate analgesic: Secondary | ICD-10-CM | POA: Diagnosis not present

## 2018-01-20 DIAGNOSIS — G8929 Other chronic pain: Secondary | ICD-10-CM | POA: Diagnosis not present

## 2018-01-20 DIAGNOSIS — M7918 Myalgia, other site: Secondary | ICD-10-CM | POA: Diagnosis not present

## 2018-01-20 DIAGNOSIS — Z79899 Other long term (current) drug therapy: Secondary | ICD-10-CM | POA: Diagnosis not present

## 2018-02-15 DIAGNOSIS — F419 Anxiety disorder, unspecified: Secondary | ICD-10-CM | POA: Diagnosis not present

## 2018-02-15 DIAGNOSIS — Z23 Encounter for immunization: Secondary | ICD-10-CM | POA: Diagnosis not present

## 2018-02-15 DIAGNOSIS — E785 Hyperlipidemia, unspecified: Secondary | ICD-10-CM | POA: Diagnosis not present

## 2018-02-15 DIAGNOSIS — K219 Gastro-esophageal reflux disease without esophagitis: Secondary | ICD-10-CM | POA: Diagnosis not present

## 2018-02-15 DIAGNOSIS — M170 Bilateral primary osteoarthritis of knee: Secondary | ICD-10-CM | POA: Diagnosis not present

## 2018-02-15 DIAGNOSIS — Z131 Encounter for screening for diabetes mellitus: Secondary | ICD-10-CM | POA: Diagnosis not present

## 2018-02-15 DIAGNOSIS — I10 Essential (primary) hypertension: Secondary | ICD-10-CM | POA: Diagnosis not present

## 2018-02-15 DIAGNOSIS — J45909 Unspecified asthma, uncomplicated: Secondary | ICD-10-CM | POA: Diagnosis not present

## 2018-02-15 DIAGNOSIS — E663 Overweight: Secondary | ICD-10-CM | POA: Diagnosis not present

## 2018-02-17 DIAGNOSIS — Z79891 Long term (current) use of opiate analgesic: Secondary | ICD-10-CM | POA: Diagnosis not present

## 2018-02-17 DIAGNOSIS — M7918 Myalgia, other site: Secondary | ICD-10-CM | POA: Diagnosis not present

## 2018-02-17 DIAGNOSIS — G8929 Other chronic pain: Secondary | ICD-10-CM | POA: Diagnosis not present

## 2018-02-17 DIAGNOSIS — Z79899 Other long term (current) drug therapy: Secondary | ICD-10-CM | POA: Diagnosis not present

## 2018-02-17 DIAGNOSIS — Z6834 Body mass index (BMI) 34.0-34.9, adult: Secondary | ICD-10-CM | POA: Diagnosis not present

## 2018-02-22 DIAGNOSIS — M170 Bilateral primary osteoarthritis of knee: Secondary | ICD-10-CM | POA: Diagnosis not present

## 2018-02-22 DIAGNOSIS — J45909 Unspecified asthma, uncomplicated: Secondary | ICD-10-CM | POA: Diagnosis not present

## 2018-02-22 DIAGNOSIS — E785 Hyperlipidemia, unspecified: Secondary | ICD-10-CM | POA: Diagnosis not present

## 2018-02-22 DIAGNOSIS — F419 Anxiety disorder, unspecified: Secondary | ICD-10-CM | POA: Diagnosis not present

## 2018-02-22 DIAGNOSIS — E663 Overweight: Secondary | ICD-10-CM | POA: Diagnosis not present

## 2018-02-22 DIAGNOSIS — Z0001 Encounter for general adult medical examination with abnormal findings: Secondary | ICD-10-CM | POA: Diagnosis not present

## 2018-02-22 DIAGNOSIS — Z23 Encounter for immunization: Secondary | ICD-10-CM | POA: Diagnosis not present

## 2018-02-22 DIAGNOSIS — Z131 Encounter for screening for diabetes mellitus: Secondary | ICD-10-CM | POA: Diagnosis not present

## 2018-02-22 DIAGNOSIS — I10 Essential (primary) hypertension: Secondary | ICD-10-CM | POA: Diagnosis not present

## 2018-02-22 DIAGNOSIS — K219 Gastro-esophageal reflux disease without esophagitis: Secondary | ICD-10-CM | POA: Diagnosis not present

## 2018-03-18 DIAGNOSIS — Z79899 Other long term (current) drug therapy: Secondary | ICD-10-CM | POA: Diagnosis not present

## 2018-03-18 DIAGNOSIS — M7918 Myalgia, other site: Secondary | ICD-10-CM | POA: Diagnosis not present

## 2018-03-18 DIAGNOSIS — Z79891 Long term (current) use of opiate analgesic: Secondary | ICD-10-CM | POA: Diagnosis not present

## 2018-03-18 DIAGNOSIS — G8929 Other chronic pain: Secondary | ICD-10-CM | POA: Diagnosis not present

## 2018-04-23 DIAGNOSIS — F1120 Opioid dependence, uncomplicated: Secondary | ICD-10-CM | POA: Diagnosis not present

## 2018-04-23 DIAGNOSIS — G8929 Other chronic pain: Secondary | ICD-10-CM | POA: Diagnosis not present

## 2018-04-23 DIAGNOSIS — M7918 Myalgia, other site: Secondary | ICD-10-CM | POA: Diagnosis not present

## 2018-04-23 DIAGNOSIS — Z79899 Other long term (current) drug therapy: Secondary | ICD-10-CM | POA: Diagnosis not present

## 2018-05-17 DIAGNOSIS — K219 Gastro-esophageal reflux disease without esophagitis: Secondary | ICD-10-CM | POA: Diagnosis not present

## 2018-05-17 DIAGNOSIS — M170 Bilateral primary osteoarthritis of knee: Secondary | ICD-10-CM | POA: Diagnosis not present

## 2018-05-17 DIAGNOSIS — J45909 Unspecified asthma, uncomplicated: Secondary | ICD-10-CM | POA: Diagnosis not present

## 2018-05-17 DIAGNOSIS — E785 Hyperlipidemia, unspecified: Secondary | ICD-10-CM | POA: Diagnosis not present

## 2018-05-17 DIAGNOSIS — F419 Anxiety disorder, unspecified: Secondary | ICD-10-CM | POA: Diagnosis not present

## 2018-05-17 DIAGNOSIS — E663 Overweight: Secondary | ICD-10-CM | POA: Diagnosis not present

## 2018-05-17 DIAGNOSIS — I10 Essential (primary) hypertension: Secondary | ICD-10-CM | POA: Diagnosis not present

## 2018-05-20 DIAGNOSIS — F1120 Opioid dependence, uncomplicated: Secondary | ICD-10-CM | POA: Diagnosis not present

## 2018-05-20 DIAGNOSIS — M7918 Myalgia, other site: Secondary | ICD-10-CM | POA: Diagnosis not present

## 2018-05-20 DIAGNOSIS — G8929 Other chronic pain: Secondary | ICD-10-CM | POA: Diagnosis not present

## 2018-05-20 DIAGNOSIS — Z79891 Long term (current) use of opiate analgesic: Secondary | ICD-10-CM | POA: Diagnosis not present

## 2018-05-20 DIAGNOSIS — Z79899 Other long term (current) drug therapy: Secondary | ICD-10-CM | POA: Diagnosis not present

## 2018-06-20 DIAGNOSIS — Z79891 Long term (current) use of opiate analgesic: Secondary | ICD-10-CM | POA: Diagnosis not present

## 2018-06-20 DIAGNOSIS — Z79899 Other long term (current) drug therapy: Secondary | ICD-10-CM | POA: Diagnosis not present

## 2018-06-20 DIAGNOSIS — G8929 Other chronic pain: Secondary | ICD-10-CM | POA: Diagnosis not present

## 2018-06-20 DIAGNOSIS — M545 Low back pain: Secondary | ICD-10-CM | POA: Diagnosis not present

## 2018-06-20 DIAGNOSIS — M7918 Myalgia, other site: Secondary | ICD-10-CM | POA: Diagnosis not present

## 2018-07-19 DIAGNOSIS — Z79899 Other long term (current) drug therapy: Secondary | ICD-10-CM | POA: Diagnosis not present

## 2018-07-19 DIAGNOSIS — M545 Low back pain: Secondary | ICD-10-CM | POA: Diagnosis not present

## 2018-07-19 DIAGNOSIS — Z79891 Long term (current) use of opiate analgesic: Secondary | ICD-10-CM | POA: Diagnosis not present

## 2018-07-19 DIAGNOSIS — G8929 Other chronic pain: Secondary | ICD-10-CM | POA: Diagnosis not present

## 2018-07-19 DIAGNOSIS — M7918 Myalgia, other site: Secondary | ICD-10-CM | POA: Diagnosis not present

## 2018-08-16 DIAGNOSIS — Z79891 Long term (current) use of opiate analgesic: Secondary | ICD-10-CM | POA: Diagnosis not present

## 2018-08-16 DIAGNOSIS — Z79899 Other long term (current) drug therapy: Secondary | ICD-10-CM | POA: Diagnosis not present

## 2018-08-16 DIAGNOSIS — M545 Low back pain: Secondary | ICD-10-CM | POA: Diagnosis not present

## 2018-08-16 DIAGNOSIS — M7918 Myalgia, other site: Secondary | ICD-10-CM | POA: Diagnosis not present

## 2018-08-16 DIAGNOSIS — G8929 Other chronic pain: Secondary | ICD-10-CM | POA: Diagnosis not present

## 2018-08-26 DIAGNOSIS — M170 Bilateral primary osteoarthritis of knee: Secondary | ICD-10-CM | POA: Diagnosis not present

## 2018-08-26 DIAGNOSIS — R7303 Prediabetes: Secondary | ICD-10-CM | POA: Diagnosis not present

## 2018-08-26 DIAGNOSIS — J301 Allergic rhinitis due to pollen: Secondary | ICD-10-CM | POA: Diagnosis not present

## 2018-08-26 DIAGNOSIS — I10 Essential (primary) hypertension: Secondary | ICD-10-CM | POA: Diagnosis not present

## 2018-08-26 DIAGNOSIS — E782 Mixed hyperlipidemia: Secondary | ICD-10-CM | POA: Diagnosis not present

## 2018-08-26 DIAGNOSIS — K219 Gastro-esophageal reflux disease without esophagitis: Secondary | ICD-10-CM | POA: Diagnosis not present

## 2018-08-26 DIAGNOSIS — E669 Obesity, unspecified: Secondary | ICD-10-CM | POA: Diagnosis not present

## 2018-08-26 DIAGNOSIS — J449 Chronic obstructive pulmonary disease, unspecified: Secondary | ICD-10-CM | POA: Diagnosis not present

## 2018-09-15 DIAGNOSIS — M545 Low back pain: Secondary | ICD-10-CM | POA: Diagnosis not present

## 2018-09-15 DIAGNOSIS — F1120 Opioid dependence, uncomplicated: Secondary | ICD-10-CM | POA: Diagnosis not present

## 2018-09-15 DIAGNOSIS — Z79899 Other long term (current) drug therapy: Secondary | ICD-10-CM | POA: Diagnosis not present

## 2018-09-15 DIAGNOSIS — G8929 Other chronic pain: Secondary | ICD-10-CM | POA: Diagnosis not present

## 2018-09-15 DIAGNOSIS — Z79891 Long term (current) use of opiate analgesic: Secondary | ICD-10-CM | POA: Diagnosis not present

## 2018-10-15 DIAGNOSIS — M7918 Myalgia, other site: Secondary | ICD-10-CM | POA: Diagnosis not present

## 2018-10-15 DIAGNOSIS — Z79899 Other long term (current) drug therapy: Secondary | ICD-10-CM | POA: Diagnosis not present

## 2018-10-15 DIAGNOSIS — G8929 Other chronic pain: Secondary | ICD-10-CM | POA: Diagnosis not present

## 2018-10-15 DIAGNOSIS — I10 Essential (primary) hypertension: Secondary | ICD-10-CM | POA: Diagnosis not present

## 2018-10-15 DIAGNOSIS — M545 Low back pain: Secondary | ICD-10-CM | POA: Diagnosis not present

## 2018-11-15 DIAGNOSIS — E782 Mixed hyperlipidemia: Secondary | ICD-10-CM | POA: Diagnosis not present

## 2018-11-15 DIAGNOSIS — G8929 Other chronic pain: Secondary | ICD-10-CM | POA: Diagnosis not present

## 2018-11-15 DIAGNOSIS — J449 Chronic obstructive pulmonary disease, unspecified: Secondary | ICD-10-CM | POA: Diagnosis not present

## 2018-11-15 DIAGNOSIS — M545 Low back pain: Secondary | ICD-10-CM | POA: Diagnosis not present

## 2018-11-15 DIAGNOSIS — Z125 Encounter for screening for malignant neoplasm of prostate: Secondary | ICD-10-CM | POA: Diagnosis not present

## 2018-11-15 DIAGNOSIS — K219 Gastro-esophageal reflux disease without esophagitis: Secondary | ICD-10-CM | POA: Diagnosis not present

## 2018-11-15 DIAGNOSIS — Z79899 Other long term (current) drug therapy: Secondary | ICD-10-CM | POA: Diagnosis not present

## 2018-11-15 DIAGNOSIS — Z1389 Encounter for screening for other disorder: Secondary | ICD-10-CM | POA: Diagnosis not present

## 2018-11-15 DIAGNOSIS — Z136 Encounter for screening for cardiovascular disorders: Secondary | ICD-10-CM | POA: Diagnosis not present

## 2018-11-15 DIAGNOSIS — Z131 Encounter for screening for diabetes mellitus: Secondary | ICD-10-CM | POA: Diagnosis not present

## 2018-11-15 DIAGNOSIS — M7918 Myalgia, other site: Secondary | ICD-10-CM | POA: Diagnosis not present

## 2018-11-15 DIAGNOSIS — I10 Essential (primary) hypertension: Secondary | ICD-10-CM | POA: Diagnosis not present

## 2018-11-15 DIAGNOSIS — Z0000 Encounter for general adult medical examination without abnormal findings: Secondary | ICD-10-CM | POA: Diagnosis not present

## 2018-12-15 DIAGNOSIS — M545 Low back pain: Secondary | ICD-10-CM | POA: Diagnosis not present

## 2018-12-15 DIAGNOSIS — I10 Essential (primary) hypertension: Secondary | ICD-10-CM | POA: Diagnosis not present

## 2018-12-15 DIAGNOSIS — Z79891 Long term (current) use of opiate analgesic: Secondary | ICD-10-CM | POA: Diagnosis not present

## 2018-12-15 DIAGNOSIS — Z79899 Other long term (current) drug therapy: Secondary | ICD-10-CM | POA: Diagnosis not present

## 2018-12-15 DIAGNOSIS — Z1159 Encounter for screening for other viral diseases: Secondary | ICD-10-CM | POA: Diagnosis not present

## 2018-12-15 DIAGNOSIS — G8929 Other chronic pain: Secondary | ICD-10-CM | POA: Diagnosis not present

## 2019-01-13 DIAGNOSIS — M545 Low back pain: Secondary | ICD-10-CM | POA: Diagnosis not present

## 2019-01-13 DIAGNOSIS — M159 Polyosteoarthritis, unspecified: Secondary | ICD-10-CM | POA: Diagnosis not present

## 2019-01-13 DIAGNOSIS — G8929 Other chronic pain: Secondary | ICD-10-CM | POA: Diagnosis not present

## 2019-01-13 DIAGNOSIS — Z79899 Other long term (current) drug therapy: Secondary | ICD-10-CM | POA: Diagnosis not present

## 2019-01-13 DIAGNOSIS — M79641 Pain in right hand: Secondary | ICD-10-CM | POA: Diagnosis not present

## 2019-01-21 DIAGNOSIS — M25572 Pain in left ankle and joints of left foot: Secondary | ICD-10-CM | POA: Diagnosis not present

## 2019-01-21 DIAGNOSIS — M25571 Pain in right ankle and joints of right foot: Secondary | ICD-10-CM | POA: Diagnosis not present

## 2019-01-21 DIAGNOSIS — M79641 Pain in right hand: Secondary | ICD-10-CM | POA: Diagnosis not present

## 2019-01-21 DIAGNOSIS — M79672 Pain in left foot: Secondary | ICD-10-CM | POA: Diagnosis not present

## 2019-02-13 DIAGNOSIS — M545 Low back pain: Secondary | ICD-10-CM | POA: Diagnosis not present

## 2019-02-13 DIAGNOSIS — G8929 Other chronic pain: Secondary | ICD-10-CM | POA: Diagnosis not present

## 2019-02-13 DIAGNOSIS — Z79899 Other long term (current) drug therapy: Secondary | ICD-10-CM | POA: Diagnosis not present

## 2019-02-13 DIAGNOSIS — I10 Essential (primary) hypertension: Secondary | ICD-10-CM | POA: Diagnosis not present

## 2019-02-24 DIAGNOSIS — Z0001 Encounter for general adult medical examination with abnormal findings: Secondary | ICD-10-CM | POA: Diagnosis not present

## 2019-02-24 DIAGNOSIS — E782 Mixed hyperlipidemia: Secondary | ICD-10-CM | POA: Diagnosis not present

## 2019-02-24 DIAGNOSIS — M170 Bilateral primary osteoarthritis of knee: Secondary | ICD-10-CM | POA: Diagnosis not present

## 2019-02-24 DIAGNOSIS — Z23 Encounter for immunization: Secondary | ICD-10-CM | POA: Diagnosis not present

## 2019-02-24 DIAGNOSIS — I10 Essential (primary) hypertension: Secondary | ICD-10-CM | POA: Diagnosis not present

## 2019-02-24 DIAGNOSIS — R7303 Prediabetes: Secondary | ICD-10-CM | POA: Diagnosis not present

## 2019-02-24 DIAGNOSIS — J449 Chronic obstructive pulmonary disease, unspecified: Secondary | ICD-10-CM | POA: Diagnosis not present

## 2019-02-24 DIAGNOSIS — R972 Elevated prostate specific antigen [PSA]: Secondary | ICD-10-CM | POA: Diagnosis not present

## 2019-02-24 DIAGNOSIS — K219 Gastro-esophageal reflux disease without esophagitis: Secondary | ICD-10-CM | POA: Diagnosis not present

## 2019-03-15 DIAGNOSIS — Z79899 Other long term (current) drug therapy: Secondary | ICD-10-CM | POA: Diagnosis not present

## 2019-03-15 DIAGNOSIS — M545 Low back pain: Secondary | ICD-10-CM | POA: Diagnosis not present

## 2019-03-15 DIAGNOSIS — Z79891 Long term (current) use of opiate analgesic: Secondary | ICD-10-CM | POA: Diagnosis not present

## 2019-03-15 DIAGNOSIS — Z1159 Encounter for screening for other viral diseases: Secondary | ICD-10-CM | POA: Diagnosis not present

## 2019-03-15 DIAGNOSIS — G8929 Other chronic pain: Secondary | ICD-10-CM | POA: Diagnosis not present

## 2019-03-15 DIAGNOSIS — F1120 Opioid dependence, uncomplicated: Secondary | ICD-10-CM | POA: Diagnosis not present

## 2019-04-10 DIAGNOSIS — Z79891 Long term (current) use of opiate analgesic: Secondary | ICD-10-CM | POA: Diagnosis not present

## 2019-04-10 DIAGNOSIS — M7918 Myalgia, other site: Secondary | ICD-10-CM | POA: Diagnosis not present

## 2019-04-10 DIAGNOSIS — M545 Low back pain: Secondary | ICD-10-CM | POA: Diagnosis not present

## 2019-04-10 DIAGNOSIS — Z79899 Other long term (current) drug therapy: Secondary | ICD-10-CM | POA: Diagnosis not present

## 2019-04-10 DIAGNOSIS — G8929 Other chronic pain: Secondary | ICD-10-CM | POA: Diagnosis not present

## 2019-04-20 DIAGNOSIS — I10 Essential (primary) hypertension: Secondary | ICD-10-CM | POA: Diagnosis not present

## 2019-04-20 DIAGNOSIS — J0100 Acute maxillary sinusitis, unspecified: Secondary | ICD-10-CM | POA: Diagnosis not present

## 2019-04-20 DIAGNOSIS — E782 Mixed hyperlipidemia: Secondary | ICD-10-CM | POA: Diagnosis not present

## 2019-04-20 DIAGNOSIS — M170 Bilateral primary osteoarthritis of knee: Secondary | ICD-10-CM | POA: Diagnosis not present

## 2019-04-20 DIAGNOSIS — R7303 Prediabetes: Secondary | ICD-10-CM | POA: Diagnosis not present

## 2019-04-20 DIAGNOSIS — R972 Elevated prostate specific antigen [PSA]: Secondary | ICD-10-CM | POA: Diagnosis not present

## 2019-04-20 DIAGNOSIS — J449 Chronic obstructive pulmonary disease, unspecified: Secondary | ICD-10-CM | POA: Diagnosis not present

## 2019-04-20 DIAGNOSIS — K219 Gastro-esophageal reflux disease without esophagitis: Secondary | ICD-10-CM | POA: Diagnosis not present

## 2019-04-20 DIAGNOSIS — E669 Obesity, unspecified: Secondary | ICD-10-CM | POA: Diagnosis not present

## 2019-05-09 DIAGNOSIS — M545 Low back pain: Secondary | ICD-10-CM | POA: Diagnosis not present

## 2019-05-09 DIAGNOSIS — Z79899 Other long term (current) drug therapy: Secondary | ICD-10-CM | POA: Diagnosis not present

## 2019-05-09 DIAGNOSIS — Z79891 Long term (current) use of opiate analgesic: Secondary | ICD-10-CM | POA: Diagnosis not present

## 2019-05-09 DIAGNOSIS — M7918 Myalgia, other site: Secondary | ICD-10-CM | POA: Diagnosis not present

## 2019-05-09 DIAGNOSIS — G8929 Other chronic pain: Secondary | ICD-10-CM | POA: Diagnosis not present

## 2019-05-24 DIAGNOSIS — R972 Elevated prostate specific antigen [PSA]: Secondary | ICD-10-CM | POA: Diagnosis not present

## 2019-06-07 DIAGNOSIS — M7918 Myalgia, other site: Secondary | ICD-10-CM | POA: Diagnosis not present

## 2019-06-07 DIAGNOSIS — G8929 Other chronic pain: Secondary | ICD-10-CM | POA: Diagnosis not present

## 2019-06-07 DIAGNOSIS — Z79899 Other long term (current) drug therapy: Secondary | ICD-10-CM | POA: Diagnosis not present

## 2019-06-07 DIAGNOSIS — Z79891 Long term (current) use of opiate analgesic: Secondary | ICD-10-CM | POA: Diagnosis not present

## 2019-06-07 DIAGNOSIS — M545 Low back pain: Secondary | ICD-10-CM | POA: Diagnosis not present

## 2019-07-04 DIAGNOSIS — Z79899 Other long term (current) drug therapy: Secondary | ICD-10-CM | POA: Diagnosis not present

## 2019-07-04 DIAGNOSIS — M545 Low back pain: Secondary | ICD-10-CM | POA: Diagnosis not present

## 2019-07-04 DIAGNOSIS — Z79891 Long term (current) use of opiate analgesic: Secondary | ICD-10-CM | POA: Diagnosis not present

## 2019-07-04 DIAGNOSIS — F1120 Opioid dependence, uncomplicated: Secondary | ICD-10-CM | POA: Diagnosis not present

## 2019-07-04 DIAGNOSIS — G8929 Other chronic pain: Secondary | ICD-10-CM | POA: Diagnosis not present

## 2019-07-10 DIAGNOSIS — C61 Malignant neoplasm of prostate: Secondary | ICD-10-CM | POA: Diagnosis not present

## 2019-07-10 DIAGNOSIS — D075 Carcinoma in situ of prostate: Secondary | ICD-10-CM | POA: Diagnosis not present

## 2019-07-10 DIAGNOSIS — R972 Elevated prostate specific antigen [PSA]: Secondary | ICD-10-CM | POA: Diagnosis not present

## 2019-07-19 DIAGNOSIS — C61 Malignant neoplasm of prostate: Secondary | ICD-10-CM | POA: Diagnosis not present

## 2019-07-25 DIAGNOSIS — M170 Bilateral primary osteoarthritis of knee: Secondary | ICD-10-CM | POA: Diagnosis not present

## 2019-07-25 DIAGNOSIS — I10 Essential (primary) hypertension: Secondary | ICD-10-CM | POA: Diagnosis not present

## 2019-07-25 DIAGNOSIS — J449 Chronic obstructive pulmonary disease, unspecified: Secondary | ICD-10-CM | POA: Diagnosis not present

## 2019-07-25 DIAGNOSIS — Z131 Encounter for screening for diabetes mellitus: Secondary | ICD-10-CM | POA: Diagnosis not present

## 2019-07-25 DIAGNOSIS — R7303 Prediabetes: Secondary | ICD-10-CM | POA: Diagnosis not present

## 2019-07-25 DIAGNOSIS — E669 Obesity, unspecified: Secondary | ICD-10-CM | POA: Diagnosis not present

## 2019-07-25 DIAGNOSIS — K219 Gastro-esophageal reflux disease without esophagitis: Secondary | ICD-10-CM | POA: Diagnosis not present

## 2019-07-25 DIAGNOSIS — E782 Mixed hyperlipidemia: Secondary | ICD-10-CM | POA: Diagnosis not present

## 2019-08-02 DIAGNOSIS — Z79899 Other long term (current) drug therapy: Secondary | ICD-10-CM | POA: Diagnosis not present

## 2019-08-02 DIAGNOSIS — G8929 Other chronic pain: Secondary | ICD-10-CM | POA: Diagnosis not present

## 2019-08-02 DIAGNOSIS — M545 Low back pain: Secondary | ICD-10-CM | POA: Diagnosis not present

## 2019-08-02 DIAGNOSIS — Z79891 Long term (current) use of opiate analgesic: Secondary | ICD-10-CM | POA: Diagnosis not present

## 2019-09-02 DIAGNOSIS — M545 Low back pain: Secondary | ICD-10-CM | POA: Diagnosis not present

## 2019-09-02 DIAGNOSIS — G8929 Other chronic pain: Secondary | ICD-10-CM | POA: Diagnosis not present

## 2019-09-02 DIAGNOSIS — Z79899 Other long term (current) drug therapy: Secondary | ICD-10-CM | POA: Diagnosis not present

## 2019-09-02 DIAGNOSIS — Z79891 Long term (current) use of opiate analgesic: Secondary | ICD-10-CM | POA: Diagnosis not present

## 2019-10-02 DIAGNOSIS — G8929 Other chronic pain: Secondary | ICD-10-CM | POA: Diagnosis not present

## 2019-10-02 DIAGNOSIS — M545 Low back pain: Secondary | ICD-10-CM | POA: Diagnosis not present

## 2019-10-02 DIAGNOSIS — Z79899 Other long term (current) drug therapy: Secondary | ICD-10-CM | POA: Diagnosis not present

## 2019-10-02 DIAGNOSIS — Z79891 Long term (current) use of opiate analgesic: Secondary | ICD-10-CM | POA: Diagnosis not present

## 2019-10-11 DIAGNOSIS — C61 Malignant neoplasm of prostate: Secondary | ICD-10-CM | POA: Diagnosis not present

## 2019-10-18 DIAGNOSIS — C61 Malignant neoplasm of prostate: Secondary | ICD-10-CM | POA: Diagnosis not present

## 2019-10-18 DIAGNOSIS — N400 Enlarged prostate without lower urinary tract symptoms: Secondary | ICD-10-CM | POA: Diagnosis not present

## 2019-10-23 DIAGNOSIS — I10 Essential (primary) hypertension: Secondary | ICD-10-CM | POA: Diagnosis not present

## 2019-10-23 DIAGNOSIS — Z131 Encounter for screening for diabetes mellitus: Secondary | ICD-10-CM | POA: Diagnosis not present

## 2019-10-23 DIAGNOSIS — E782 Mixed hyperlipidemia: Secondary | ICD-10-CM | POA: Diagnosis not present

## 2019-10-23 DIAGNOSIS — E669 Obesity, unspecified: Secondary | ICD-10-CM | POA: Diagnosis not present

## 2019-10-23 DIAGNOSIS — R7303 Prediabetes: Secondary | ICD-10-CM | POA: Diagnosis not present

## 2019-10-23 DIAGNOSIS — K219 Gastro-esophageal reflux disease without esophagitis: Secondary | ICD-10-CM | POA: Diagnosis not present

## 2019-10-23 DIAGNOSIS — M170 Bilateral primary osteoarthritis of knee: Secondary | ICD-10-CM | POA: Diagnosis not present

## 2019-10-23 DIAGNOSIS — J449 Chronic obstructive pulmonary disease, unspecified: Secondary | ICD-10-CM | POA: Diagnosis not present

## 2019-10-24 DIAGNOSIS — H524 Presbyopia: Secondary | ICD-10-CM | POA: Diagnosis not present

## 2019-10-24 DIAGNOSIS — Z0100 Encounter for examination of eyes and vision without abnormal findings: Secondary | ICD-10-CM | POA: Diagnosis not present

## 2019-11-01 DIAGNOSIS — M545 Low back pain: Secondary | ICD-10-CM | POA: Diagnosis not present

## 2019-11-01 DIAGNOSIS — G8929 Other chronic pain: Secondary | ICD-10-CM | POA: Diagnosis not present

## 2019-11-01 DIAGNOSIS — Z79899 Other long term (current) drug therapy: Secondary | ICD-10-CM | POA: Diagnosis not present

## 2019-11-01 DIAGNOSIS — I10 Essential (primary) hypertension: Secondary | ICD-10-CM | POA: Diagnosis not present

## 2019-11-16 DIAGNOSIS — Z131 Encounter for screening for diabetes mellitus: Secondary | ICD-10-CM | POA: Diagnosis not present

## 2019-11-16 DIAGNOSIS — E782 Mixed hyperlipidemia: Secondary | ICD-10-CM | POA: Diagnosis not present

## 2019-11-16 DIAGNOSIS — I10 Essential (primary) hypertension: Secondary | ICD-10-CM | POA: Diagnosis not present

## 2019-11-16 DIAGNOSIS — Z0110 Encounter for examination of ears and hearing without abnormal findings: Secondary | ICD-10-CM | POA: Diagnosis not present

## 2019-11-16 DIAGNOSIS — K219 Gastro-esophageal reflux disease without esophagitis: Secondary | ICD-10-CM | POA: Diagnosis not present

## 2019-11-16 DIAGNOSIS — R7303 Prediabetes: Secondary | ICD-10-CM | POA: Diagnosis not present

## 2019-11-16 DIAGNOSIS — Z0000 Encounter for general adult medical examination without abnormal findings: Secondary | ICD-10-CM | POA: Diagnosis not present

## 2019-11-16 DIAGNOSIS — J449 Chronic obstructive pulmonary disease, unspecified: Secondary | ICD-10-CM | POA: Diagnosis not present

## 2019-11-16 DIAGNOSIS — Z1389 Encounter for screening for other disorder: Secondary | ICD-10-CM | POA: Diagnosis not present

## 2019-12-01 DIAGNOSIS — Z79891 Long term (current) use of opiate analgesic: Secondary | ICD-10-CM | POA: Diagnosis not present

## 2019-12-01 DIAGNOSIS — M545 Low back pain: Secondary | ICD-10-CM | POA: Diagnosis not present

## 2019-12-01 DIAGNOSIS — G8929 Other chronic pain: Secondary | ICD-10-CM | POA: Diagnosis not present

## 2019-12-01 DIAGNOSIS — Z79899 Other long term (current) drug therapy: Secondary | ICD-10-CM | POA: Diagnosis not present

## 2020-01-01 DIAGNOSIS — M25572 Pain in left ankle and joints of left foot: Secondary | ICD-10-CM | POA: Diagnosis not present

## 2020-01-01 DIAGNOSIS — Z79899 Other long term (current) drug therapy: Secondary | ICD-10-CM | POA: Diagnosis not present

## 2020-01-01 DIAGNOSIS — M545 Low back pain: Secondary | ICD-10-CM | POA: Diagnosis not present

## 2020-01-01 DIAGNOSIS — M25571 Pain in right ankle and joints of right foot: Secondary | ICD-10-CM | POA: Diagnosis not present

## 2020-01-01 DIAGNOSIS — G8929 Other chronic pain: Secondary | ICD-10-CM | POA: Diagnosis not present

## 2020-01-31 DIAGNOSIS — G8929 Other chronic pain: Secondary | ICD-10-CM | POA: Diagnosis not present

## 2020-01-31 DIAGNOSIS — M25571 Pain in right ankle and joints of right foot: Secondary | ICD-10-CM | POA: Diagnosis not present

## 2020-01-31 DIAGNOSIS — M545 Low back pain: Secondary | ICD-10-CM | POA: Diagnosis not present

## 2020-01-31 DIAGNOSIS — Z79899 Other long term (current) drug therapy: Secondary | ICD-10-CM | POA: Diagnosis not present

## 2020-02-26 DIAGNOSIS — Z0001 Encounter for general adult medical examination with abnormal findings: Secondary | ICD-10-CM | POA: Diagnosis not present

## 2020-02-26 DIAGNOSIS — E782 Mixed hyperlipidemia: Secondary | ICD-10-CM | POA: Diagnosis not present

## 2020-02-26 DIAGNOSIS — E669 Obesity, unspecified: Secondary | ICD-10-CM | POA: Diagnosis not present

## 2020-02-26 DIAGNOSIS — R7303 Prediabetes: Secondary | ICD-10-CM | POA: Diagnosis not present

## 2020-02-26 DIAGNOSIS — J449 Chronic obstructive pulmonary disease, unspecified: Secondary | ICD-10-CM | POA: Diagnosis not present

## 2020-02-26 DIAGNOSIS — I10 Essential (primary) hypertension: Secondary | ICD-10-CM | POA: Diagnosis not present

## 2020-02-26 DIAGNOSIS — K219 Gastro-esophageal reflux disease without esophagitis: Secondary | ICD-10-CM | POA: Diagnosis not present

## 2020-02-26 DIAGNOSIS — M170 Bilateral primary osteoarthritis of knee: Secondary | ICD-10-CM | POA: Diagnosis not present

## 2020-03-01 DIAGNOSIS — M25571 Pain in right ankle and joints of right foot: Secondary | ICD-10-CM | POA: Diagnosis not present

## 2020-03-01 DIAGNOSIS — G8929 Other chronic pain: Secondary | ICD-10-CM | POA: Diagnosis not present

## 2020-03-01 DIAGNOSIS — M545 Low back pain, unspecified: Secondary | ICD-10-CM | POA: Diagnosis not present

## 2020-03-01 DIAGNOSIS — Z79899 Other long term (current) drug therapy: Secondary | ICD-10-CM | POA: Diagnosis not present

## 2020-04-01 DIAGNOSIS — M25571 Pain in right ankle and joints of right foot: Secondary | ICD-10-CM | POA: Diagnosis not present

## 2020-04-01 DIAGNOSIS — G8929 Other chronic pain: Secondary | ICD-10-CM | POA: Diagnosis not present

## 2020-04-01 DIAGNOSIS — M545 Low back pain, unspecified: Secondary | ICD-10-CM | POA: Diagnosis not present

## 2020-04-01 DIAGNOSIS — Z79899 Other long term (current) drug therapy: Secondary | ICD-10-CM | POA: Diagnosis not present

## 2020-04-08 DIAGNOSIS — C61 Malignant neoplasm of prostate: Secondary | ICD-10-CM | POA: Diagnosis not present

## 2020-04-15 DIAGNOSIS — C61 Malignant neoplasm of prostate: Secondary | ICD-10-CM | POA: Diagnosis not present

## 2020-04-15 DIAGNOSIS — N400 Enlarged prostate without lower urinary tract symptoms: Secondary | ICD-10-CM | POA: Diagnosis not present

## 2020-05-01 DIAGNOSIS — E782 Mixed hyperlipidemia: Secondary | ICD-10-CM | POA: Diagnosis not present

## 2020-05-01 DIAGNOSIS — Z23 Encounter for immunization: Secondary | ICD-10-CM | POA: Diagnosis not present

## 2020-05-01 DIAGNOSIS — J449 Chronic obstructive pulmonary disease, unspecified: Secondary | ICD-10-CM | POA: Diagnosis not present

## 2020-05-01 DIAGNOSIS — M25571 Pain in right ankle and joints of right foot: Secondary | ICD-10-CM | POA: Diagnosis not present

## 2020-05-01 DIAGNOSIS — K219 Gastro-esophageal reflux disease without esophagitis: Secondary | ICD-10-CM | POA: Diagnosis not present

## 2020-05-01 DIAGNOSIS — M545 Low back pain, unspecified: Secondary | ICD-10-CM | POA: Diagnosis not present

## 2020-05-01 DIAGNOSIS — I10 Essential (primary) hypertension: Secondary | ICD-10-CM | POA: Diagnosis not present

## 2020-05-01 DIAGNOSIS — G8929 Other chronic pain: Secondary | ICD-10-CM | POA: Diagnosis not present

## 2020-05-01 DIAGNOSIS — M170 Bilateral primary osteoarthritis of knee: Secondary | ICD-10-CM | POA: Diagnosis not present

## 2020-05-01 DIAGNOSIS — R7303 Prediabetes: Secondary | ICD-10-CM | POA: Diagnosis not present

## 2020-05-01 DIAGNOSIS — M25572 Pain in left ankle and joints of left foot: Secondary | ICD-10-CM | POA: Diagnosis not present

## 2020-05-01 DIAGNOSIS — Z79899 Other long term (current) drug therapy: Secondary | ICD-10-CM | POA: Diagnosis not present

## 2020-05-01 DIAGNOSIS — E669 Obesity, unspecified: Secondary | ICD-10-CM | POA: Diagnosis not present

## 2020-05-29 DIAGNOSIS — M25571 Pain in right ankle and joints of right foot: Secondary | ICD-10-CM | POA: Diagnosis not present

## 2020-05-29 DIAGNOSIS — G8929 Other chronic pain: Secondary | ICD-10-CM | POA: Diagnosis not present

## 2020-05-29 DIAGNOSIS — Z79899 Other long term (current) drug therapy: Secondary | ICD-10-CM | POA: Diagnosis not present

## 2020-05-29 DIAGNOSIS — M545 Low back pain, unspecified: Secondary | ICD-10-CM | POA: Diagnosis not present

## 2020-06-28 DIAGNOSIS — Z79899 Other long term (current) drug therapy: Secondary | ICD-10-CM | POA: Diagnosis not present

## 2020-06-28 DIAGNOSIS — M545 Low back pain, unspecified: Secondary | ICD-10-CM | POA: Diagnosis not present

## 2020-06-28 DIAGNOSIS — M25571 Pain in right ankle and joints of right foot: Secondary | ICD-10-CM | POA: Diagnosis not present

## 2020-06-28 DIAGNOSIS — G8929 Other chronic pain: Secondary | ICD-10-CM | POA: Diagnosis not present

## 2020-06-28 DIAGNOSIS — M25572 Pain in left ankle and joints of left foot: Secondary | ICD-10-CM | POA: Diagnosis not present

## 2020-07-26 DIAGNOSIS — M25572 Pain in left ankle and joints of left foot: Secondary | ICD-10-CM | POA: Diagnosis not present

## 2020-07-26 DIAGNOSIS — G8929 Other chronic pain: Secondary | ICD-10-CM | POA: Diagnosis not present

## 2020-07-26 DIAGNOSIS — Z79899 Other long term (current) drug therapy: Secondary | ICD-10-CM | POA: Diagnosis not present

## 2020-07-26 DIAGNOSIS — M25571 Pain in right ankle and joints of right foot: Secondary | ICD-10-CM | POA: Diagnosis not present

## 2020-07-26 DIAGNOSIS — M545 Low back pain, unspecified: Secondary | ICD-10-CM | POA: Diagnosis not present

## 2020-07-30 DIAGNOSIS — E782 Mixed hyperlipidemia: Secondary | ICD-10-CM | POA: Diagnosis not present

## 2020-07-30 DIAGNOSIS — I10 Essential (primary) hypertension: Secondary | ICD-10-CM | POA: Diagnosis not present

## 2020-07-30 DIAGNOSIS — M170 Bilateral primary osteoarthritis of knee: Secondary | ICD-10-CM | POA: Diagnosis not present

## 2020-07-30 DIAGNOSIS — R7303 Prediabetes: Secondary | ICD-10-CM | POA: Diagnosis not present

## 2020-07-30 DIAGNOSIS — K219 Gastro-esophageal reflux disease without esophagitis: Secondary | ICD-10-CM | POA: Diagnosis not present

## 2020-07-30 DIAGNOSIS — J449 Chronic obstructive pulmonary disease, unspecified: Secondary | ICD-10-CM | POA: Diagnosis not present

## 2020-07-30 DIAGNOSIS — E669 Obesity, unspecified: Secondary | ICD-10-CM | POA: Diagnosis not present

## 2020-08-23 DIAGNOSIS — M545 Low back pain, unspecified: Secondary | ICD-10-CM | POA: Diagnosis not present

## 2020-08-23 DIAGNOSIS — Z79899 Other long term (current) drug therapy: Secondary | ICD-10-CM | POA: Diagnosis not present

## 2020-08-23 DIAGNOSIS — G8929 Other chronic pain: Secondary | ICD-10-CM | POA: Diagnosis not present

## 2020-09-20 DIAGNOSIS — M25571 Pain in right ankle and joints of right foot: Secondary | ICD-10-CM | POA: Diagnosis not present

## 2020-09-20 DIAGNOSIS — G8929 Other chronic pain: Secondary | ICD-10-CM | POA: Diagnosis not present

## 2020-09-20 DIAGNOSIS — Z79899 Other long term (current) drug therapy: Secondary | ICD-10-CM | POA: Diagnosis not present

## 2020-09-20 DIAGNOSIS — M545 Low back pain, unspecified: Secondary | ICD-10-CM | POA: Diagnosis not present

## 2020-10-18 DIAGNOSIS — M25571 Pain in right ankle and joints of right foot: Secondary | ICD-10-CM | POA: Diagnosis not present

## 2020-10-18 DIAGNOSIS — Z79899 Other long term (current) drug therapy: Secondary | ICD-10-CM | POA: Diagnosis not present

## 2020-10-18 DIAGNOSIS — M545 Low back pain, unspecified: Secondary | ICD-10-CM | POA: Diagnosis not present

## 2020-10-18 DIAGNOSIS — G8929 Other chronic pain: Secondary | ICD-10-CM | POA: Diagnosis not present

## 2020-11-06 DIAGNOSIS — I10 Essential (primary) hypertension: Secondary | ICD-10-CM | POA: Diagnosis not present

## 2020-11-06 DIAGNOSIS — E782 Mixed hyperlipidemia: Secondary | ICD-10-CM | POA: Diagnosis not present

## 2020-11-06 DIAGNOSIS — Z136 Encounter for screening for cardiovascular disorders: Secondary | ICD-10-CM | POA: Diagnosis not present

## 2020-11-06 DIAGNOSIS — Z131 Encounter for screening for diabetes mellitus: Secondary | ICD-10-CM | POA: Diagnosis not present

## 2020-11-06 DIAGNOSIS — J449 Chronic obstructive pulmonary disease, unspecified: Secondary | ICD-10-CM | POA: Diagnosis not present

## 2020-11-06 DIAGNOSIS — K219 Gastro-esophageal reflux disease without esophagitis: Secondary | ICD-10-CM | POA: Diagnosis not present

## 2020-11-06 DIAGNOSIS — M170 Bilateral primary osteoarthritis of knee: Secondary | ICD-10-CM | POA: Diagnosis not present

## 2020-11-06 DIAGNOSIS — R7303 Prediabetes: Secondary | ICD-10-CM | POA: Diagnosis not present

## 2020-11-06 DIAGNOSIS — Z125 Encounter for screening for malignant neoplasm of prostate: Secondary | ICD-10-CM | POA: Diagnosis not present

## 2020-11-06 DIAGNOSIS — Z0001 Encounter for general adult medical examination with abnormal findings: Secondary | ICD-10-CM | POA: Diagnosis not present

## 2020-11-16 DIAGNOSIS — G8929 Other chronic pain: Secondary | ICD-10-CM | POA: Diagnosis not present

## 2020-11-16 DIAGNOSIS — M545 Low back pain, unspecified: Secondary | ICD-10-CM | POA: Diagnosis not present

## 2020-11-16 DIAGNOSIS — M25571 Pain in right ankle and joints of right foot: Secondary | ICD-10-CM | POA: Diagnosis not present

## 2020-11-16 DIAGNOSIS — M25572 Pain in left ankle and joints of left foot: Secondary | ICD-10-CM | POA: Diagnosis not present

## 2020-11-16 DIAGNOSIS — Z79899 Other long term (current) drug therapy: Secondary | ICD-10-CM | POA: Diagnosis not present

## 2020-12-05 DIAGNOSIS — K219 Gastro-esophageal reflux disease without esophagitis: Secondary | ICD-10-CM | POA: Diagnosis not present

## 2020-12-05 DIAGNOSIS — E782 Mixed hyperlipidemia: Secondary | ICD-10-CM | POA: Diagnosis not present

## 2020-12-05 DIAGNOSIS — R7303 Prediabetes: Secondary | ICD-10-CM | POA: Diagnosis not present

## 2020-12-05 DIAGNOSIS — J449 Chronic obstructive pulmonary disease, unspecified: Secondary | ICD-10-CM | POA: Diagnosis not present

## 2020-12-05 DIAGNOSIS — I10 Essential (primary) hypertension: Secondary | ICD-10-CM | POA: Diagnosis not present

## 2020-12-05 DIAGNOSIS — R972 Elevated prostate specific antigen [PSA]: Secondary | ICD-10-CM | POA: Diagnosis not present

## 2020-12-05 DIAGNOSIS — M170 Bilateral primary osteoarthritis of knee: Secondary | ICD-10-CM | POA: Diagnosis not present

## 2020-12-14 DIAGNOSIS — G8929 Other chronic pain: Secondary | ICD-10-CM | POA: Diagnosis not present

## 2020-12-14 DIAGNOSIS — F1120 Opioid dependence, uncomplicated: Secondary | ICD-10-CM | POA: Diagnosis not present

## 2020-12-14 DIAGNOSIS — M7918 Myalgia, other site: Secondary | ICD-10-CM | POA: Diagnosis not present

## 2020-12-14 DIAGNOSIS — Z79899 Other long term (current) drug therapy: Secondary | ICD-10-CM | POA: Diagnosis not present

## 2020-12-14 DIAGNOSIS — M545 Low back pain, unspecified: Secondary | ICD-10-CM | POA: Diagnosis not present

## 2021-01-14 DIAGNOSIS — M545 Low back pain, unspecified: Secondary | ICD-10-CM | POA: Diagnosis not present

## 2021-01-14 DIAGNOSIS — G8929 Other chronic pain: Secondary | ICD-10-CM | POA: Diagnosis not present

## 2021-01-14 DIAGNOSIS — Z79899 Other long term (current) drug therapy: Secondary | ICD-10-CM | POA: Diagnosis not present

## 2021-01-14 DIAGNOSIS — F1120 Opioid dependence, uncomplicated: Secondary | ICD-10-CM | POA: Diagnosis not present

## 2021-01-14 DIAGNOSIS — Z79891 Long term (current) use of opiate analgesic: Secondary | ICD-10-CM | POA: Diagnosis not present

## 2021-02-14 DIAGNOSIS — M25572 Pain in left ankle and joints of left foot: Secondary | ICD-10-CM | POA: Diagnosis not present

## 2021-02-14 DIAGNOSIS — M25571 Pain in right ankle and joints of right foot: Secondary | ICD-10-CM | POA: Diagnosis not present

## 2021-02-14 DIAGNOSIS — G8929 Other chronic pain: Secondary | ICD-10-CM | POA: Diagnosis not present

## 2021-02-14 DIAGNOSIS — M545 Low back pain, unspecified: Secondary | ICD-10-CM | POA: Diagnosis not present

## 2021-02-14 DIAGNOSIS — Z79899 Other long term (current) drug therapy: Secondary | ICD-10-CM | POA: Diagnosis not present

## 2021-02-25 DIAGNOSIS — I10 Essential (primary) hypertension: Secondary | ICD-10-CM | POA: Diagnosis not present

## 2021-02-25 DIAGNOSIS — E559 Vitamin D deficiency, unspecified: Secondary | ICD-10-CM | POA: Diagnosis not present

## 2021-02-25 DIAGNOSIS — K219 Gastro-esophageal reflux disease without esophagitis: Secondary | ICD-10-CM | POA: Diagnosis not present

## 2021-02-25 DIAGNOSIS — Z23 Encounter for immunization: Secondary | ICD-10-CM | POA: Diagnosis not present

## 2021-02-25 DIAGNOSIS — Z0001 Encounter for general adult medical examination with abnormal findings: Secondary | ICD-10-CM | POA: Diagnosis not present

## 2021-02-25 DIAGNOSIS — E782 Mixed hyperlipidemia: Secondary | ICD-10-CM | POA: Diagnosis not present

## 2021-02-25 DIAGNOSIS — J449 Chronic obstructive pulmonary disease, unspecified: Secondary | ICD-10-CM | POA: Diagnosis not present

## 2021-02-25 DIAGNOSIS — R972 Elevated prostate specific antigen [PSA]: Secondary | ICD-10-CM | POA: Diagnosis not present

## 2021-02-25 DIAGNOSIS — M170 Bilateral primary osteoarthritis of knee: Secondary | ICD-10-CM | POA: Diagnosis not present

## 2021-02-25 DIAGNOSIS — R7303 Prediabetes: Secondary | ICD-10-CM | POA: Diagnosis not present

## 2021-03-15 DIAGNOSIS — M545 Low back pain, unspecified: Secondary | ICD-10-CM | POA: Diagnosis not present

## 2021-03-15 DIAGNOSIS — G8929 Other chronic pain: Secondary | ICD-10-CM | POA: Diagnosis not present

## 2021-03-15 DIAGNOSIS — Z79899 Other long term (current) drug therapy: Secondary | ICD-10-CM | POA: Diagnosis not present

## 2021-03-15 DIAGNOSIS — M25572 Pain in left ankle and joints of left foot: Secondary | ICD-10-CM | POA: Diagnosis not present

## 2021-03-15 DIAGNOSIS — M25571 Pain in right ankle and joints of right foot: Secondary | ICD-10-CM | POA: Diagnosis not present

## 2021-03-25 DIAGNOSIS — I10 Essential (primary) hypertension: Secondary | ICD-10-CM | POA: Diagnosis not present

## 2021-04-16 DIAGNOSIS — G8929 Other chronic pain: Secondary | ICD-10-CM | POA: Diagnosis not present

## 2021-04-16 DIAGNOSIS — M25571 Pain in right ankle and joints of right foot: Secondary | ICD-10-CM | POA: Diagnosis not present

## 2021-04-16 DIAGNOSIS — Z79899 Other long term (current) drug therapy: Secondary | ICD-10-CM | POA: Diagnosis not present

## 2021-04-16 DIAGNOSIS — M545 Low back pain, unspecified: Secondary | ICD-10-CM | POA: Diagnosis not present

## 2021-04-16 DIAGNOSIS — M25572 Pain in left ankle and joints of left foot: Secondary | ICD-10-CM | POA: Diagnosis not present

## 2021-05-16 DIAGNOSIS — M545 Low back pain, unspecified: Secondary | ICD-10-CM | POA: Diagnosis not present

## 2021-05-16 DIAGNOSIS — M25572 Pain in left ankle and joints of left foot: Secondary | ICD-10-CM | POA: Diagnosis not present

## 2021-05-16 DIAGNOSIS — M25571 Pain in right ankle and joints of right foot: Secondary | ICD-10-CM | POA: Diagnosis not present

## 2021-05-16 DIAGNOSIS — Z79899 Other long term (current) drug therapy: Secondary | ICD-10-CM | POA: Diagnosis not present

## 2021-05-16 DIAGNOSIS — G8929 Other chronic pain: Secondary | ICD-10-CM | POA: Diagnosis not present

## 2021-06-17 DIAGNOSIS — Z79899 Other long term (current) drug therapy: Secondary | ICD-10-CM | POA: Diagnosis not present

## 2021-06-17 DIAGNOSIS — M25571 Pain in right ankle and joints of right foot: Secondary | ICD-10-CM | POA: Diagnosis not present

## 2021-06-17 DIAGNOSIS — G8929 Other chronic pain: Secondary | ICD-10-CM | POA: Diagnosis not present

## 2021-06-17 DIAGNOSIS — M25572 Pain in left ankle and joints of left foot: Secondary | ICD-10-CM | POA: Diagnosis not present

## 2021-06-17 DIAGNOSIS — M545 Low back pain, unspecified: Secondary | ICD-10-CM | POA: Diagnosis not present

## 2021-06-25 DIAGNOSIS — J449 Chronic obstructive pulmonary disease, unspecified: Secondary | ICD-10-CM | POA: Diagnosis not present

## 2021-06-25 DIAGNOSIS — J0100 Acute maxillary sinusitis, unspecified: Secondary | ICD-10-CM | POA: Diagnosis not present

## 2021-06-25 DIAGNOSIS — R7303 Prediabetes: Secondary | ICD-10-CM | POA: Diagnosis not present

## 2021-06-25 DIAGNOSIS — I10 Essential (primary) hypertension: Secondary | ICD-10-CM | POA: Diagnosis not present

## 2021-06-25 DIAGNOSIS — E782 Mixed hyperlipidemia: Secondary | ICD-10-CM | POA: Diagnosis not present

## 2021-06-25 DIAGNOSIS — E559 Vitamin D deficiency, unspecified: Secondary | ICD-10-CM | POA: Diagnosis not present

## 2021-06-25 DIAGNOSIS — K219 Gastro-esophageal reflux disease without esophagitis: Secondary | ICD-10-CM | POA: Diagnosis not present

## 2021-06-25 DIAGNOSIS — M170 Bilateral primary osteoarthritis of knee: Secondary | ICD-10-CM | POA: Diagnosis not present

## 2021-07-11 ENCOUNTER — Other Ambulatory Visit: Payer: Self-pay | Admitting: Urology

## 2021-07-11 DIAGNOSIS — C61 Malignant neoplasm of prostate: Secondary | ICD-10-CM

## 2021-07-18 DIAGNOSIS — G8929 Other chronic pain: Secondary | ICD-10-CM | POA: Diagnosis not present

## 2021-07-18 DIAGNOSIS — Z79899 Other long term (current) drug therapy: Secondary | ICD-10-CM | POA: Diagnosis not present

## 2021-07-18 DIAGNOSIS — M25572 Pain in left ankle and joints of left foot: Secondary | ICD-10-CM | POA: Diagnosis not present

## 2021-07-18 DIAGNOSIS — M25571 Pain in right ankle and joints of right foot: Secondary | ICD-10-CM | POA: Diagnosis not present

## 2021-07-18 DIAGNOSIS — M545 Low back pain, unspecified: Secondary | ICD-10-CM | POA: Diagnosis not present

## 2021-08-09 ENCOUNTER — Ambulatory Visit
Admission: RE | Admit: 2021-08-09 | Discharge: 2021-08-09 | Disposition: A | Discharge status: Home / Self Care | Payer: Medicare Other | Source: Ambulatory Visit | Attending: Urology | Admitting: Urology

## 2021-08-09 DIAGNOSIS — C61 Malignant neoplasm of prostate: Secondary | ICD-10-CM

## 2021-08-09 MED ORDER — GADOBENATE DIMEGLUMINE 529 MG/ML IV SOLN
20.0000 mL | Freq: Once | INTRAVENOUS | Status: AC | PRN
  Administered 2021-08-09: 20 mL via INTRAVENOUS

## 2021-08-15 DIAGNOSIS — M25571 Pain in right ankle and joints of right foot: Secondary | ICD-10-CM | POA: Diagnosis not present

## 2021-08-15 DIAGNOSIS — G8929 Other chronic pain: Secondary | ICD-10-CM | POA: Diagnosis not present

## 2021-08-15 DIAGNOSIS — M545 Low back pain, unspecified: Secondary | ICD-10-CM | POA: Diagnosis not present

## 2021-08-15 DIAGNOSIS — Z79899 Other long term (current) drug therapy: Secondary | ICD-10-CM | POA: Diagnosis not present

## 2021-08-15 DIAGNOSIS — M25572 Pain in left ankle and joints of left foot: Secondary | ICD-10-CM | POA: Diagnosis not present

## 2021-09-08 DIAGNOSIS — I10 Essential (primary) hypertension: Secondary | ICD-10-CM | POA: Diagnosis not present

## 2021-09-14 DIAGNOSIS — G8929 Other chronic pain: Secondary | ICD-10-CM | POA: Diagnosis not present

## 2021-09-14 DIAGNOSIS — M25571 Pain in right ankle and joints of right foot: Secondary | ICD-10-CM | POA: Diagnosis not present

## 2021-09-14 DIAGNOSIS — Z79899 Other long term (current) drug therapy: Secondary | ICD-10-CM | POA: Diagnosis not present

## 2021-09-14 DIAGNOSIS — M25572 Pain in left ankle and joints of left foot: Secondary | ICD-10-CM | POA: Diagnosis not present

## 2021-09-14 DIAGNOSIS — M545 Low back pain, unspecified: Secondary | ICD-10-CM | POA: Diagnosis not present

## 2021-10-06 NOTE — Progress Notes (Signed)
Radiation Oncology         [phone] ________________________________  Initial Outpatient Consultation  Name: Jason Pugh MRN: [id number]  Date: 10/07/2021  DOB: 1960-07-18  XL:[id number], Jason Stallion, MD  Jerilee Field, MD   REFERRING PHYSICIAN: Jerilee Field, MD  DIAGNOSIS: 61 y.o. gentleman with Stage T1c adenocarcinoma of the prostate with Gleason score of 3+3, and PSA of 4.89.    ICD-10-CM   1. Malignant neoplasm of prostate (HCC)  C61     2. Hypertension  I10     3. Hyperlipidemia, unspecified hyperlipidemia type  E78.5     4. Chronic bronchitis, unspecified chronic bronchitis type (HCC)  J42     5. Anxiety state  F41.1       HISTORY OF PRESENT ILLNESS: Jason Pugh is a 61 y.o. male with a diagnosis of prostate cancer. He was initially diagnosed with low volume Gleason 3+3 prostate cancer in 1 of 12 cores on prostate biopsy on 07/10/19 by Dr. Mena Pugh. PSA at that time was 4.3.  After thorough discussion of treatment options, he appropriately elected to proceed with active surveillance. His PSA remained overall stable, between 3 -4.  His most recent PSA was 4.89 in 06/2021.  A surveillance prostate MRI was performed on 08/09/21 showing a 1.4 cm PI-RADS 5 lesion in the left anterior apical transition zone and fibromuscular stroma with probable extracapsular extension.  Additionally, there was a 1.4 cm PI-RADS 4 lesion in the right anterior apical transition zone with no evidence of pelvic metastatic disease.  The patient proceeded to MRI fusion biopsy of the prostate on 09/16/21.  The prostate volume measured 60.06 cc.  Out of 18 core biopsies, 8 were positive.  The maximum Gleason score was 3+3, and this was seen in all three samples from ROI #1, all three samples from ROI #2 (with perineural invasion), as well as in the right mid lateral, and right apex lateral (small focus) cores.  The patient reviewed the biopsy results with his urologist and he has kindly been  referred today for discussion of potential radiation treatment options.   PREVIOUS RADIATION THERAPY: No  PAST MEDICAL HISTORY: History reviewed. No pertinent past medical history.    PAST SURGICAL HISTORY:History reviewed. No pertinent surgical history.  FAMILY HISTORY:  Family History  Problem Relation Age of Onset   Cancer Mother    Hypertension Mother     SOCIAL HISTORY:  Social History   Socioeconomic History   Marital status: Divorced    Spouse name: Not on file   Number of children: Not on file   Years of education: Not on file   Highest education level: Not on file  Occupational History   Not on file  Tobacco Use   Smoking status: Never   Smokeless tobacco: Never  Substance and Sexual Activity   Alcohol use: Never   Drug use: Never   Sexual activity: Not on file  Other Topics Concern   Not on file  Social History Narrative   Not on file   Social Determinants of Health   Financial Resource Strain: Not on file  Food Insecurity: Not on file  Transportation Needs: Not on file  Physical Activity: Not on file  Stress: Not on file  Social Connections: Not on file  Intimate Partner Violence: Not on file    ALLERGIES: Patient has no known allergies.  MEDICATIONS:  Current Outpatient Medications  Medication Sig Dispense Refill   albuterol (VENTOLIN HFA) 108 (90 Base) MCG/ACT inhaler SMARTSIG:2 Puff(s) By Mouth  4 Times Daily     amLODipine (NORVASC) 5 MG tablet Take 5 mg by mouth daily.     DULoxetine (CYMBALTA) 60 MG capsule Take 60 mg by mouth daily.     esomeprazole (NEXIUM) 40 MG capsule Take 40 mg by mouth daily.     losartan-hydrochlorothiazide (HYZAAR) 100-25 MG tablet Take 1 tablet by mouth daily.     metoprolol succinate (TOPROL-XL) 100 MG 24 hr tablet Take 100 mg by mouth daily.     oxyCODONE (ROXICODONE) 15 MG immediate release tablet Take 15 mg by mouth 4 (four) times daily.     pravastatin (PRAVACHOL) 20 MG tablet Take 20 mg by mouth daily.      No current facility-administered medications for this encounter.    REVIEW OF SYSTEMS:  On review of systems, the patient reports that he is doing well overall. He denies any chest pain, shortness of breath, cough, fevers, chills, night sweats, unintended weight changes. He denies any bowel disturbances, and denies abdominal pain, nausea or vomiting. He reports right ankle pain, rated 8/10 today. His IPSS was 7, indicating mild urinary symptoms. His SHIM was 22, indicating he has minimal erectile dysfunction. A complete review of systems is obtained and is otherwise negative.    PHYSICAL EXAM:  Wt Readings from Last 3 Encounters:  10/07/21 277 lb 6 oz (125.8 kg)   Temp Readings from Last 3 Encounters:  10/07/21 (!) 97.2 F (36.2 C) (Temporal)   BP Readings from Last 3 Encounters:  10/07/21 (!) 162/85   Pulse Readings from Last 3 Encounters:  10/07/21 63   Pain Assessment Pain Score: 8  Pain Loc: Ankle (right ankle and left toe)/10  In general this is a well appearing African-American male in no acute distress. He's alert and oriented x4 and appropriate throughout the examination. Cardiopulmonary assessment is negative for acute distress, and he exhibits normal effort.     KPS = 100  100 - Normal; no complaints; no evidence of disease. 90   - Able to carry on normal activity; minor signs or symptoms of disease. 80   - Normal activity with effort; some signs or symptoms of disease. 9   - Cares for self; unable to carry on normal activity or to do active work. 60   - Requires occasional assistance, but is able to care for most of his personal needs. 50   - Requires considerable assistance and frequent medical care. 40   - Disabled; requires special care and assistance. 30   - Severely disabled; hospital admission is indicated although death not imminent. 20   - Very sick; hospital admission necessary; active supportive treatment necessary. 10   - Moribund; fatal processes  progressing rapidly. 0     - Dead  Karnofsky DA, Abelmann WH, Craver LS and Burchenal JH [phone]) The use of the nitrogen mustards in the palliative treatment of carcinoma: with particular reference to bronchogenic carcinoma Cancer 1 634-56  LABORATORY DATA:  No results found for: WBC, HGB, HCT, MCV, PLT No results found for: NA, K, CL, CO2 No results found for: ALT, AST, GGT, ALKPHOS, BILITOT   RADIOGRAPHY: No results found.    IMPRESSION/PLAN: 1. 61 y.o. gentleman with Stage T1c adenocarcinoma of the prostate with Gleason Score of 3+3, and PSA of 4.89. We discussed the patient's workup and outlined the nature of prostate cancer in this setting. The patient's T stage, Gleason's score, and PSA put him into the low risk group but there is evidence of disease  progression with a larger volume of the involved cores on biopsy. Accordingly, he is eligible for a variety of potential treatment options including continued active surveillance, brachytherapy, 5.5 weeks of external radiation, or prostatectomy. We discussed the available radiation techniques, and focused on the details and logistics of delivery. We discussed and outlined the risks, benefits, short and long-term effects associated with radiotherapy and compared and contrasted these with prostatectomy. We discussed the role of SpaceOAR gel in reducing the rectal toxicity associated with radiotherapy. He appears to have a good understanding of his disease and our treatment recommendations which are of curative intent.  He was encouraged to ask questions that were answered to his stated satisfaction.  At the conclusion of our conversation, the patient is interested in moving forward with 5.5 weeks of external beam therapy. We will share our discussion with Dr. Mena Pugh and make arrangements for fiducial markers and SpaceOAR gel placement, first available, prior to simulation, to reduce rectal toxicity from radiotherapy. The patient appears to have a  good understanding of his disease and our treatment recommendations which are of curative intent and is in agreement with the stated plan.  Therefore, we will move forward with treatment planning accordingly, in anticipation of beginning IMRT in the near future.  We personally spent 70 minutes in this encounter including chart review, reviewing radiological studies, meeting face-to-face with the patient, entering orders and completing documentation.    Marguarite Arbour, PA-C    Margaretmary Dys, MD  [facility]  Radiation Oncology Direct Dial: [phone]  Fax: [phone] [email or web address]  Skype  LinkedIn   This document serves as a record of services personally performed by Margaretmary Dys, MD and Marcello Fennel, PA-C. It was created on their behalf by Mickie Bail, a trained medical scribe. The creation of this record is based on the scribe's personal observations and the provider's statements to them. This document has been checked and approved by the attending provider.

## 2021-10-07 ENCOUNTER — Ambulatory Visit
Admission: RE | Admit: 2021-10-07 | Discharge: 2021-10-07 | Disposition: A | Discharge status: Home / Self Care | Payer: Medicare Other | Source: Ambulatory Visit | Attending: Radiation Oncology | Admitting: Radiation Oncology

## 2021-10-07 ENCOUNTER — Encounter: Payer: Self-pay | Admitting: Radiation Oncology

## 2021-10-07 ENCOUNTER — Other Ambulatory Visit: Payer: Self-pay

## 2021-10-07 VITALS — BP 162/85 | HR 63 | Temp 97.2°F | Resp 18 | Ht 75.0 in | Wt 277.4 lb

## 2021-10-07 DIAGNOSIS — Z809 Family history of malignant neoplasm, unspecified: Secondary | ICD-10-CM | POA: Diagnosis not present

## 2021-10-07 DIAGNOSIS — C61 Malignant neoplasm of prostate: Secondary | ICD-10-CM

## 2021-10-07 DIAGNOSIS — Z79899 Other long term (current) drug therapy: Secondary | ICD-10-CM | POA: Insufficient documentation

## 2021-10-07 DIAGNOSIS — E785 Hyperlipidemia, unspecified: Secondary | ICD-10-CM

## 2021-10-07 DIAGNOSIS — F411 Generalized anxiety disorder: Secondary | ICD-10-CM | POA: Insufficient documentation

## 2021-10-07 DIAGNOSIS — J42 Unspecified chronic bronchitis: Secondary | ICD-10-CM | POA: Insufficient documentation

## 2021-10-07 DIAGNOSIS — Z191 Hormone sensitive malignancy status: Secondary | ICD-10-CM | POA: Diagnosis not present

## 2021-10-07 DIAGNOSIS — I10 Essential (primary) hypertension: Secondary | ICD-10-CM | POA: Insufficient documentation

## 2021-10-07 NOTE — Progress Notes (Signed)
GU Location of Tumor / Histology: Stage T1c adenocarcinoma of the prostate  ? ?If Prostate Cancer, Gleason Score is (3 + 3) and PSA is (4.89 as of 08/2021). ? ?Biopsies: ?Dr. Junious Silk ? ? ? ? ?Past/Anticipated interventions by urology, if any:  ? ?Past/Anticipated interventions by medical oncology, if any: NA ? ?Weight changes, if any: No ? ?IPSS:  7 ?SHIM:  22 ? ?Bowel/Bladder complaints, if any:  No ? ?Nausea/Vomiting, if any: No ? ?Pain issues, if any:  8/10 ? ?SAFETY ISSUES: ?Prior radiation? No ?Pacemaker/ICD? No ?Possible current pregnancy? Male ?Is the patient on methotrexate? No ? ?Current Complaints / other details:   ?

## 2021-10-07 NOTE — Progress Notes (Signed)
Introduced myself to the patient as the prostate nurse navigator.  No barriers to care identified at this time.  He is here to discuss his radiation treatment options.  I gave him my business card and asked him to call me with questions or concerns.  Verbalized understanding.  ?

## 2021-10-09 DIAGNOSIS — Z191 Hormone sensitive malignancy status: Secondary | ICD-10-CM | POA: Diagnosis not present

## 2021-10-15 DIAGNOSIS — M25571 Pain in right ankle and joints of right foot: Secondary | ICD-10-CM | POA: Diagnosis not present

## 2021-10-15 DIAGNOSIS — Z79899 Other long term (current) drug therapy: Secondary | ICD-10-CM | POA: Diagnosis not present

## 2021-10-15 DIAGNOSIS — M25572 Pain in left ankle and joints of left foot: Secondary | ICD-10-CM | POA: Diagnosis not present

## 2021-10-15 DIAGNOSIS — G8929 Other chronic pain: Secondary | ICD-10-CM | POA: Diagnosis not present

## 2021-10-15 DIAGNOSIS — M545 Low back pain, unspecified: Secondary | ICD-10-CM | POA: Diagnosis not present

## 2021-10-21 ENCOUNTER — Other Ambulatory Visit: Payer: Self-pay | Admitting: Urology

## 2021-10-23 ENCOUNTER — Telehealth: Payer: Self-pay | Admitting: *Deleted

## 2021-10-23 NOTE — Telephone Encounter (Signed)
CALLED PATIENT TO INFORM OF FID. MARKERS AND SPACE OAR PLACEMENT ON 11-21-21 AND HIS SIM APPT. ON 11-27-21 - ARRIVAL TIME- 2:45 PM @ [facility], NO ANSWER WILL CALL LATER

## 2021-11-06 DIAGNOSIS — E559 Vitamin D deficiency, unspecified: Secondary | ICD-10-CM | POA: Diagnosis not present

## 2021-11-06 DIAGNOSIS — Z131 Encounter for screening for diabetes mellitus: Secondary | ICD-10-CM | POA: Diagnosis not present

## 2021-11-06 DIAGNOSIS — Z136 Encounter for screening for cardiovascular disorders: Secondary | ICD-10-CM | POA: Diagnosis not present

## 2021-11-06 DIAGNOSIS — E782 Mixed hyperlipidemia: Secondary | ICD-10-CM | POA: Diagnosis not present

## 2021-11-06 DIAGNOSIS — R7303 Prediabetes: Secondary | ICD-10-CM | POA: Diagnosis not present

## 2021-11-06 DIAGNOSIS — Z1329 Encounter for screening for other suspected endocrine disorder: Secondary | ICD-10-CM | POA: Diagnosis not present

## 2021-11-06 DIAGNOSIS — K219 Gastro-esophageal reflux disease without esophagitis: Secondary | ICD-10-CM | POA: Diagnosis not present

## 2021-11-06 DIAGNOSIS — Z0001 Encounter for general adult medical examination with abnormal findings: Secondary | ICD-10-CM | POA: Diagnosis not present

## 2021-11-06 DIAGNOSIS — I10 Essential (primary) hypertension: Secondary | ICD-10-CM | POA: Diagnosis not present

## 2021-11-06 DIAGNOSIS — M170 Bilateral primary osteoarthritis of knee: Secondary | ICD-10-CM | POA: Diagnosis not present

## 2021-11-06 DIAGNOSIS — J449 Chronic obstructive pulmonary disease, unspecified: Secondary | ICD-10-CM | POA: Diagnosis not present

## 2021-11-12 DIAGNOSIS — M545 Low back pain, unspecified: Secondary | ICD-10-CM | POA: Diagnosis not present

## 2021-11-12 DIAGNOSIS — M25571 Pain in right ankle and joints of right foot: Secondary | ICD-10-CM | POA: Diagnosis not present

## 2021-11-12 DIAGNOSIS — Z79899 Other long term (current) drug therapy: Secondary | ICD-10-CM | POA: Diagnosis not present

## 2021-11-12 DIAGNOSIS — M25572 Pain in left ankle and joints of left foot: Secondary | ICD-10-CM | POA: Diagnosis not present

## 2021-11-12 DIAGNOSIS — G8929 Other chronic pain: Secondary | ICD-10-CM | POA: Diagnosis not present

## 2021-11-18 ENCOUNTER — Encounter (HOSPITAL_BASED_OUTPATIENT_CLINIC_OR_DEPARTMENT_OTHER): Payer: Self-pay | Admitting: Urology

## 2021-11-18 ENCOUNTER — Other Ambulatory Visit: Payer: Self-pay

## 2021-11-18 NOTE — Progress Notes (Addendum)
Spoke w/ via phone for pre-op interview---pt Lab needs dos----   istat, ekg           Lab results------none COVID test -----patient states asymptomatic no test needed Arrive at -------630 am 11-21-2021 NPO after MN NO Solid Food.  Clear liquids from MN until---530 am Med rec completed Medications to take morning of surgery -----ventolin inhaler use/bring, amlodiline, cymbalta, nexium, metoprolol succinate, pravastatin, oxycodone Diabetic medication -----n/a Patient instructed no nail polish to be worn day of surgery Patient instructed to bring photo id and insurance card day of surgery Patient aware to have Driver (ride ) / caregiver   girlfriend Jason Pugh will stay dos  for 24 hours after surgery  Patient Special Instructions -----fleets enema morning of surgery Pre-Op special Istructions -----n/a Patient verbalized understanding of instructions that were given at this phone interview. Patient denies shortness of breath, chest pain, fever, cough at this phone interview.

## 2021-11-21 ENCOUNTER — Other Ambulatory Visit: Payer: Self-pay

## 2021-11-21 ENCOUNTER — Encounter (HOSPITAL_BASED_OUTPATIENT_CLINIC_OR_DEPARTMENT_OTHER): Payer: Self-pay | Admitting: Urology

## 2021-11-21 ENCOUNTER — Ambulatory Visit (HOSPITAL_BASED_OUTPATIENT_CLINIC_OR_DEPARTMENT_OTHER)
Admission: RE | Admit: 2021-11-21 | Discharge: 2021-11-21 | Disposition: A | Discharge status: Home / Self Care | Payer: Medicare Other | Source: Ambulatory Visit | Attending: Urology | Admitting: Urology

## 2021-11-21 ENCOUNTER — Encounter (HOSPITAL_BASED_OUTPATIENT_CLINIC_OR_DEPARTMENT_OTHER)
Admission: RE | Disposition: A | Discharge status: Home / Self Care | Payer: Self-pay | Source: Ambulatory Visit | Attending: Urology

## 2021-11-21 ENCOUNTER — Ambulatory Visit (HOSPITAL_BASED_OUTPATIENT_CLINIC_OR_DEPARTMENT_OTHER): Payer: Medicare Other | Admitting: Certified Registered Nurse Anesthetist

## 2021-11-21 DIAGNOSIS — F419 Anxiety disorder, unspecified: Secondary | ICD-10-CM | POA: Insufficient documentation

## 2021-11-21 DIAGNOSIS — C61 Malignant neoplasm of prostate: Secondary | ICD-10-CM

## 2021-11-21 DIAGNOSIS — I10 Essential (primary) hypertension: Secondary | ICD-10-CM | POA: Insufficient documentation

## 2021-11-21 DIAGNOSIS — K219 Gastro-esophageal reflux disease without esophagitis: Secondary | ICD-10-CM | POA: Diagnosis not present

## 2021-11-21 DIAGNOSIS — E669 Obesity, unspecified: Secondary | ICD-10-CM

## 2021-11-21 DIAGNOSIS — Z01818 Encounter for other preprocedural examination: Secondary | ICD-10-CM

## 2021-11-21 HISTORY — DX: Malignant neoplasm of prostate: C61

## 2021-11-21 HISTORY — DX: Unspecified osteoarthritis, unspecified site: M19.90

## 2021-11-21 HISTORY — PX: GOLD SEED IMPLANT: SHX6343

## 2021-11-21 HISTORY — PX: SPACE OAR INSTILLATION: SHX6769

## 2021-11-21 HISTORY — DX: Pure hypercholesterolemia, unspecified: E78.00

## 2021-11-21 HISTORY — DX: Gastro-esophageal reflux disease without esophagitis: K21.9

## 2021-11-21 HISTORY — DX: Essential (primary) hypertension: I10

## 2021-11-21 LAB — POCT I-STAT, CHEM 8
BUN: 13 mg/dL (ref 8–23)
Calcium, Ion: 1.28 mmol/L (ref 1.15–1.40)
Chloride: 99 mmol/L (ref 98–111)
Creatinine, Ser: 1.1 mg/dL (ref 0.61–1.24)
Glucose, Bld: 107 mg/dL — ABNORMAL HIGH (ref 70–99)
HCT: 40 % (ref 39.0–52.0)
Hemoglobin: 13.6 g/dL (ref 13.0–17.0)
Potassium: 3.8 mmol/L (ref 3.5–5.1)
Sodium: 142 mmol/L (ref 135–145)
TCO2: 29 mmol/L (ref 22–32)

## 2021-11-21 SURGERY — INSERTION, GOLD SEEDS
Anesthesia: Monitor Anesthesia Care | Site: Prostate

## 2021-11-21 MED ORDER — FENTANYL CITRATE (PF) 100 MCG/2ML IJ SOLN
INTRAMUSCULAR | Status: AC
  Filled 2021-11-21: qty 2

## 2021-11-21 MED ORDER — FLEET ENEMA 7-19 GM/118ML RE ENEM: 1.0000 | ENEMA | Freq: Once | RECTAL | Status: DC

## 2021-11-21 MED ORDER — MIDAZOLAM HCL 2 MG/2ML IJ SOLN
INTRAMUSCULAR | Status: DC | PRN
  Administered 2021-11-21: 1 mg via INTRAVENOUS

## 2021-11-21 MED ORDER — ONDANSETRON HCL 4 MG/2ML IJ SOLN
INTRAMUSCULAR | Status: DC | PRN
  Administered 2021-11-21: 4 mg via INTRAVENOUS

## 2021-11-21 MED ORDER — PROPOFOL 500 MG/50ML IV EMUL
INTRAVENOUS | Status: DC | PRN
  Administered 2021-11-21: 150 ug/kg/min via INTRAVENOUS

## 2021-11-21 MED ORDER — CEFAZOLIN SODIUM-DEXTROSE 2-4 GM/100ML-% IV SOLN
INTRAVENOUS | Status: AC
  Filled 2021-11-21: qty 100

## 2021-11-21 MED ORDER — MIDAZOLAM HCL 2 MG/2ML IJ SOLN
INTRAMUSCULAR | Status: AC
  Filled 2021-11-21: qty 2

## 2021-11-21 MED ORDER — FENTANYL CITRATE (PF) 250 MCG/5ML IJ SOLN
INTRAMUSCULAR | Status: DC | PRN
  Administered 2021-11-21: 50 ug via INTRAVENOUS

## 2021-11-21 MED ORDER — ACETAMINOPHEN 325 MG PO TABS: 325.0000 mg | ORAL_TABLET | ORAL | Status: DC | PRN

## 2021-11-21 MED ORDER — FENTANYL CITRATE (PF) 100 MCG/2ML IJ SOLN: 25.0000 ug | INTRAMUSCULAR | Status: DC | PRN

## 2021-11-21 MED ORDER — OXYCODONE HCL 5 MG/5ML PO SOLN: 5.0000 mg | Freq: Once | ORAL | Status: DC | PRN

## 2021-11-21 MED ORDER — PROPOFOL 10 MG/ML IV BOLUS
INTRAVENOUS | Status: DC | PRN
  Administered 2021-11-21: 20 mg via INTRAVENOUS

## 2021-11-21 MED ORDER — TAMSULOSIN HCL 0.4 MG PO CAPS: 0.4000 mg | ORAL_CAPSULE | Freq: Every day | ORAL | 3 refills | Status: DC | PRN

## 2021-11-21 MED ORDER — LIDOCAINE 2% (20 MG/ML) 5 ML SYRINGE
INTRAMUSCULAR | Status: DC | PRN
  Administered 2021-11-21: 40 mg via INTRAVENOUS

## 2021-11-21 MED ORDER — SODIUM CHLORIDE (PF) 0.9 % IJ SOLN
INTRAMUSCULAR | Status: DC | PRN
  Administered 2021-11-21: 10 mL

## 2021-11-21 MED ORDER — CEFAZOLIN SODIUM-DEXTROSE 2-4 GM/100ML-% IV SOLN
2.0000 g | Freq: Once | INTRAVENOUS | Status: AC
  Administered 2021-11-21: 2 g via INTRAVENOUS

## 2021-11-21 MED ORDER — OXYCODONE HCL 5 MG PO TABS: 5.0000 mg | ORAL_TABLET | Freq: Once | ORAL | Status: DC | PRN

## 2021-11-21 MED ORDER — ONDANSETRON HCL 4 MG/2ML IJ SOLN: 4.0000 mg | Freq: Once | INTRAMUSCULAR | Status: DC | PRN

## 2021-11-21 MED ORDER — ACETAMINOPHEN 160 MG/5ML PO SOLN: 325.0000 mg | ORAL | Status: DC | PRN

## 2021-11-21 MED ORDER — LACTATED RINGERS IV SOLN: INTRAVENOUS | Status: DC

## 2021-11-21 SURGICAL SUPPLY — 23 items
BLADE CLIPPER SENSICLIP SURGIC (BLADE) ×2 IMPLANT
CNTNR URN SCR LID CUP LEK RST (MISCELLANEOUS) ×1 IMPLANT
CONT SPEC 4OZ STRL OR WHT (MISCELLANEOUS) ×1
COVER BACK TABLE 60X90IN (DRAPES) ×2 IMPLANT
DRSG TEGADERM 4X4.75 (GAUZE/BANDAGES/DRESSINGS) ×2 IMPLANT
DRSG TEGADERM 8X12 (GAUZE/BANDAGES/DRESSINGS) ×2 IMPLANT
GAUZE SPONGE 4X4 12PLY STRL (GAUZE/BANDAGES/DRESSINGS) ×2 IMPLANT
GLOVE BIO SURGEON STRL SZ7.5 (GLOVE) ×2 IMPLANT
IMPL SPACEOAR VUE SYSTEM (Spacer) ×1 IMPLANT
IMPLANT SPACEOAR VUE SYSTEM (Spacer) ×2 IMPLANT
KIT TURNOVER CYSTO (KITS) ×2 IMPLANT
MARKER GOLD PRELOAD 1.2X3 (Urological Implant) ×1 IMPLANT
MARKER SKIN DUAL TIP RULER LAB (MISCELLANEOUS) ×2 IMPLANT
NDL SPNL 22GX3.5 QUINCKE BK (NEEDLE) ×1 IMPLANT
NEEDLE SPNL 22GX3.5 QUINCKE BK (NEEDLE) IMPLANT
SEED GOLD PRELOAD 1.2X3 (Urological Implant) ×2 IMPLANT
SHEATH ULTRASOUND LF (SHEATH) IMPLANT
SHEATH ULTRASOUND LTX NONSTRL (SHEATH) ×1 IMPLANT
SURGILUBE 2OZ TUBE FLIPTOP (MISCELLANEOUS) ×2 IMPLANT
SYR 10ML LL (SYRINGE) IMPLANT
SYR CONTROL 10ML LL (SYRINGE) ×1 IMPLANT
TOWEL OR 17X26 10 PK STRL BLUE (TOWEL DISPOSABLE) ×2 IMPLANT
UNDERPAD 30X36 HEAVY ABSORB (UNDERPADS AND DIAPERS) ×2 IMPLANT

## 2021-11-24 ENCOUNTER — Telehealth: Payer: Self-pay | Admitting: *Deleted

## 2021-11-24 ENCOUNTER — Encounter (HOSPITAL_BASED_OUTPATIENT_CLINIC_OR_DEPARTMENT_OTHER): Payer: Self-pay | Admitting: Urology

## 2021-11-27 ENCOUNTER — Ambulatory Visit
Admission: RE | Admit: 2021-11-27 | Discharge: 2021-11-27 | Disposition: A | Discharge status: Home / Self Care | Payer: Medicare Other | Source: Ambulatory Visit | Attending: Radiation Oncology | Admitting: Radiation Oncology

## 2021-11-27 DIAGNOSIS — C61 Malignant neoplasm of prostate: Secondary | ICD-10-CM | POA: Insufficient documentation

## 2021-11-27 DIAGNOSIS — Z191 Hormone sensitive malignancy status: Secondary | ICD-10-CM | POA: Diagnosis not present

## 2021-11-27 NOTE — Progress Notes (Signed)
  Radiation Oncology         [phone]) [phone] ________________________________  Name: Jason Pugh MRN: [id number]  Date: 11/27/2021  DOB: [birth year]  SIMULATION AND TREATMENT PLANNING NOTE    ICD-10-CM   1. Malignant neoplasm of prostate ([place])  C61       DIAGNOSIS:  61 y.o. gentleman with Stage T1c adenocarcinoma of the prostate with Gleason score of 3+3, and PSA of 4.89  NARRATIVE:  The patient was brought to the [facility] [facility].  Identity was confirmed.  All relevant records and images related to the planned course of therapy were reviewed.  The patient freely provided informed written consent to proceed with treatment after reviewing the details related to the planned course of therapy. The consent form was witnessed and verified by the simulation staff.  Then, the patient was set-up in a stable reproducible supine position for radiation therapy.  A vacuum lock pillow device was custom fabricated to position his legs in a reproducible immobilized position.  Then, supervised the performance of a urethrogram under sterile conditions to identify the prostatic apex.  CT images were obtained.  Surface markings were placed.  The CT images were loaded into the planning software.  Then the prostate target and avoidance structures including the rectum, bladder, bowel and hips were contoured.  Treatment planning then occurred.  The radiation prescription was entered and confirmed.  A total of one complex treatment devices was fabricated. I have requested : Intensity Modulated Radiotherapy (IMRT) is medically necessary for this case for the following reason:  Rectal sparing.  I have requested daily cone beam CT volumetric image gudiance to track gold fiducial posiitoning along with bladder and rectal filling, this is medically necessary to assure accurate positioning of high dose radiation.  PLAN:  The patient will receive 70 Gy in 28 fractions.  ________________________________  Sheral Apley  Tammi Klippel, M.D.

## 2021-12-01 DIAGNOSIS — C61 Malignant neoplasm of prostate: Secondary | ICD-10-CM | POA: Insufficient documentation

## 2021-12-01 DIAGNOSIS — Z191 Hormone sensitive malignancy status: Secondary | ICD-10-CM | POA: Diagnosis not present

## 2021-12-09 ENCOUNTER — Ambulatory Visit
Admission: RE | Admit: 2021-12-09 | Discharge: 2021-12-09 | Disposition: A | Discharge status: Home / Self Care | Payer: Medicare Other | Source: Ambulatory Visit | Attending: Radiation Oncology | Admitting: Radiation Oncology

## 2021-12-09 ENCOUNTER — Other Ambulatory Visit: Payer: Self-pay

## 2021-12-09 DIAGNOSIS — C61 Malignant neoplasm of prostate: Secondary | ICD-10-CM | POA: Diagnosis not present

## 2021-12-09 DIAGNOSIS — Z510 Encounter for antineoplastic radiation therapy: Secondary | ICD-10-CM | POA: Diagnosis not present

## 2021-12-09 DIAGNOSIS — Z191 Hormone sensitive malignancy status: Secondary | ICD-10-CM | POA: Diagnosis not present

## 2021-12-09 LAB — RAD ONC ARIA SESSION SUMMARY
Course Elapsed Days: 0
Plan Fractions Treated to Date: 1
Plan Prescribed Dose Per Fraction: 2.5 Gy
Plan Total Fractions Prescribed: 28
Plan Total Prescribed Dose: 70 Gy
Reference Point Dosage Given to Date: 2.5 Gy
Reference Point Session Dosage Given: 2.5 Gy
Session Number: 1

## 2021-12-10 ENCOUNTER — Other Ambulatory Visit: Payer: Self-pay

## 2021-12-10 ENCOUNTER — Ambulatory Visit
Admission: RE | Admit: 2021-12-10 | Discharge: 2021-12-10 | Disposition: A | Discharge status: Home / Self Care | Payer: Medicare Other | Source: Ambulatory Visit | Attending: Radiation Oncology | Admitting: Radiation Oncology

## 2021-12-10 DIAGNOSIS — C61 Malignant neoplasm of prostate: Secondary | ICD-10-CM | POA: Diagnosis not present

## 2021-12-10 DIAGNOSIS — Z510 Encounter for antineoplastic radiation therapy: Secondary | ICD-10-CM | POA: Diagnosis not present

## 2021-12-10 DIAGNOSIS — Z191 Hormone sensitive malignancy status: Secondary | ICD-10-CM | POA: Diagnosis not present

## 2021-12-10 LAB — RAD ONC ARIA SESSION SUMMARY
Course Elapsed Days: 1
Plan Fractions Treated to Date: 2
Plan Prescribed Dose Per Fraction: 2.5 Gy
Plan Total Fractions Prescribed: 28
Plan Total Prescribed Dose: 70 Gy
Reference Point Dosage Given to Date: 5 Gy
Reference Point Session Dosage Given: 2.5 Gy
Session Number: 2

## 2021-12-11 ENCOUNTER — Other Ambulatory Visit: Payer: Self-pay

## 2021-12-11 ENCOUNTER — Ambulatory Visit
Admission: RE | Admit: 2021-12-11 | Discharge: 2021-12-11 | Disposition: A | Discharge status: Home / Self Care | Payer: Medicare Other | Source: Ambulatory Visit | Attending: Radiation Oncology | Admitting: Radiation Oncology

## 2021-12-11 DIAGNOSIS — Z191 Hormone sensitive malignancy status: Secondary | ICD-10-CM | POA: Diagnosis not present

## 2021-12-11 DIAGNOSIS — Z510 Encounter for antineoplastic radiation therapy: Secondary | ICD-10-CM | POA: Diagnosis not present

## 2021-12-11 DIAGNOSIS — C61 Malignant neoplasm of prostate: Secondary | ICD-10-CM | POA: Diagnosis not present

## 2021-12-11 LAB — RAD ONC ARIA SESSION SUMMARY
Course Elapsed Days: 2
Plan Fractions Treated to Date: 3
Plan Prescribed Dose Per Fraction: 2.5 Gy
Plan Total Fractions Prescribed: 28
Plan Total Prescribed Dose: 70 Gy
Reference Point Dosage Given to Date: 7.5 Gy
Reference Point Session Dosage Given: 2.5 Gy
Session Number: 3

## 2021-12-12 ENCOUNTER — Other Ambulatory Visit: Payer: Self-pay

## 2021-12-12 ENCOUNTER — Ambulatory Visit
Admission: RE | Admit: 2021-12-12 | Discharge: 2021-12-12 | Disposition: A | Discharge status: Home / Self Care | Payer: Medicare Other | Source: Ambulatory Visit | Attending: Radiation Oncology | Admitting: Radiation Oncology

## 2021-12-12 DIAGNOSIS — Z510 Encounter for antineoplastic radiation therapy: Secondary | ICD-10-CM | POA: Diagnosis not present

## 2021-12-12 DIAGNOSIS — M25572 Pain in left ankle and joints of left foot: Secondary | ICD-10-CM | POA: Diagnosis not present

## 2021-12-12 DIAGNOSIS — C61 Malignant neoplasm of prostate: Secondary | ICD-10-CM | POA: Diagnosis not present

## 2021-12-12 DIAGNOSIS — M25571 Pain in right ankle and joints of right foot: Secondary | ICD-10-CM | POA: Diagnosis not present

## 2021-12-12 DIAGNOSIS — M159 Polyosteoarthritis, unspecified: Secondary | ICD-10-CM | POA: Diagnosis not present

## 2021-12-12 DIAGNOSIS — Z191 Hormone sensitive malignancy status: Secondary | ICD-10-CM | POA: Diagnosis not present

## 2021-12-12 DIAGNOSIS — G8929 Other chronic pain: Secondary | ICD-10-CM | POA: Diagnosis not present

## 2021-12-12 DIAGNOSIS — M545 Low back pain, unspecified: Secondary | ICD-10-CM | POA: Diagnosis not present

## 2021-12-12 DIAGNOSIS — Z79891 Long term (current) use of opiate analgesic: Secondary | ICD-10-CM | POA: Diagnosis not present

## 2021-12-12 DIAGNOSIS — Z79899 Other long term (current) drug therapy: Secondary | ICD-10-CM | POA: Diagnosis not present

## 2021-12-12 DIAGNOSIS — M7918 Myalgia, other site: Secondary | ICD-10-CM | POA: Diagnosis not present

## 2021-12-12 LAB — RAD ONC ARIA SESSION SUMMARY
Course Elapsed Days: 3
Plan Fractions Treated to Date: 4
Plan Prescribed Dose Per Fraction: 2.5 Gy
Plan Total Fractions Prescribed: 28
Plan Total Prescribed Dose: 70 Gy
Reference Point Dosage Given to Date: 10 Gy
Reference Point Session Dosage Given: 2.5 Gy
Session Number: 4

## 2021-12-15 ENCOUNTER — Ambulatory Visit
Admission: RE | Admit: 2021-12-15 | Discharge: 2021-12-15 | Disposition: A | Discharge status: Home / Self Care | Payer: Medicare Other | Source: Ambulatory Visit | Attending: Radiation Oncology | Admitting: Radiation Oncology

## 2021-12-15 ENCOUNTER — Other Ambulatory Visit: Payer: Self-pay

## 2021-12-15 DIAGNOSIS — C61 Malignant neoplasm of prostate: Secondary | ICD-10-CM | POA: Diagnosis not present

## 2021-12-15 DIAGNOSIS — Z191 Hormone sensitive malignancy status: Secondary | ICD-10-CM | POA: Diagnosis not present

## 2021-12-15 DIAGNOSIS — Z510 Encounter for antineoplastic radiation therapy: Secondary | ICD-10-CM | POA: Diagnosis not present

## 2021-12-15 LAB — RAD ONC ARIA SESSION SUMMARY
Course Elapsed Days: 6
Plan Fractions Treated to Date: 5
Plan Prescribed Dose Per Fraction: 2.5 Gy
Plan Total Fractions Prescribed: 28
Plan Total Prescribed Dose: 70 Gy
Reference Point Dosage Given to Date: 12.5 Gy
Reference Point Session Dosage Given: 2.5 Gy
Session Number: 5

## 2021-12-16 ENCOUNTER — Ambulatory Visit
Admission: RE | Admit: 2021-12-16 | Discharge: 2021-12-16 | Disposition: A | Discharge status: Home / Self Care | Payer: Medicare Other | Source: Ambulatory Visit | Attending: Radiation Oncology | Admitting: Radiation Oncology

## 2021-12-16 ENCOUNTER — Other Ambulatory Visit: Payer: Self-pay

## 2021-12-16 DIAGNOSIS — C61 Malignant neoplasm of prostate: Secondary | ICD-10-CM | POA: Diagnosis not present

## 2021-12-16 DIAGNOSIS — Z510 Encounter for antineoplastic radiation therapy: Secondary | ICD-10-CM | POA: Diagnosis not present

## 2021-12-16 DIAGNOSIS — Z191 Hormone sensitive malignancy status: Secondary | ICD-10-CM | POA: Diagnosis not present

## 2021-12-16 LAB — RAD ONC ARIA SESSION SUMMARY
Course Elapsed Days: 7
Plan Fractions Treated to Date: 6
Plan Prescribed Dose Per Fraction: 2.5 Gy
Plan Total Fractions Prescribed: 28
Plan Total Prescribed Dose: 70 Gy
Reference Point Dosage Given to Date: 15 Gy
Reference Point Session Dosage Given: 2.5 Gy
Session Number: 6

## 2021-12-17 ENCOUNTER — Ambulatory Visit
Admission: RE | Admit: 2021-12-17 | Discharge: 2021-12-17 | Disposition: A | Discharge status: Home / Self Care | Payer: Medicare Other | Source: Ambulatory Visit | Attending: Radiation Oncology | Admitting: Radiation Oncology

## 2021-12-17 ENCOUNTER — Other Ambulatory Visit: Payer: Self-pay

## 2021-12-17 DIAGNOSIS — Z510 Encounter for antineoplastic radiation therapy: Secondary | ICD-10-CM | POA: Diagnosis not present

## 2021-12-17 DIAGNOSIS — C61 Malignant neoplasm of prostate: Secondary | ICD-10-CM | POA: Diagnosis not present

## 2021-12-17 DIAGNOSIS — Z191 Hormone sensitive malignancy status: Secondary | ICD-10-CM | POA: Diagnosis not present

## 2021-12-17 LAB — RAD ONC ARIA SESSION SUMMARY
Course Elapsed Days: 8
Plan Fractions Treated to Date: 7
Plan Prescribed Dose Per Fraction: 2.5 Gy
Plan Total Fractions Prescribed: 28
Plan Total Prescribed Dose: 70 Gy
Reference Point Dosage Given to Date: 17.5 Gy
Reference Point Session Dosage Given: 2.5 Gy
Session Number: 7

## 2021-12-18 ENCOUNTER — Other Ambulatory Visit: Payer: Self-pay

## 2021-12-18 ENCOUNTER — Ambulatory Visit
Admission: RE | Admit: 2021-12-18 | Discharge: 2021-12-18 | Disposition: A | Discharge status: Home / Self Care | Payer: Medicare Other | Source: Ambulatory Visit | Attending: Radiation Oncology | Admitting: Radiation Oncology

## 2021-12-18 DIAGNOSIS — Z510 Encounter for antineoplastic radiation therapy: Secondary | ICD-10-CM | POA: Diagnosis not present

## 2021-12-18 DIAGNOSIS — C61 Malignant neoplasm of prostate: Secondary | ICD-10-CM | POA: Diagnosis not present

## 2021-12-18 DIAGNOSIS — Z191 Hormone sensitive malignancy status: Secondary | ICD-10-CM | POA: Diagnosis not present

## 2021-12-18 LAB — RAD ONC ARIA SESSION SUMMARY
Course Elapsed Days: 9
Plan Fractions Treated to Date: 8
Plan Prescribed Dose Per Fraction: 2.5 Gy
Plan Total Fractions Prescribed: 28
Plan Total Prescribed Dose: 70 Gy
Reference Point Dosage Given to Date: 20 Gy
Reference Point Session Dosage Given: 2.5 Gy
Session Number: 8

## 2021-12-19 ENCOUNTER — Ambulatory Visit
Admission: RE | Admit: 2021-12-19 | Discharge: 2021-12-19 | Disposition: A | Discharge status: Home / Self Care | Payer: Medicare Other | Source: Ambulatory Visit | Attending: Radiation Oncology | Admitting: Radiation Oncology

## 2021-12-19 ENCOUNTER — Other Ambulatory Visit: Payer: Self-pay

## 2021-12-19 DIAGNOSIS — Z191 Hormone sensitive malignancy status: Secondary | ICD-10-CM | POA: Diagnosis not present

## 2021-12-19 DIAGNOSIS — Z510 Encounter for antineoplastic radiation therapy: Secondary | ICD-10-CM | POA: Diagnosis not present

## 2021-12-19 DIAGNOSIS — C61 Malignant neoplasm of prostate: Secondary | ICD-10-CM | POA: Diagnosis not present

## 2021-12-19 LAB — RAD ONC ARIA SESSION SUMMARY
Course Elapsed Days: 10
Plan Fractions Treated to Date: 9
Plan Prescribed Dose Per Fraction: 2.5 Gy
Plan Total Fractions Prescribed: 28
Plan Total Prescribed Dose: 70 Gy
Reference Point Dosage Given to Date: 22.5 Gy
Reference Point Session Dosage Given: 2.5 Gy
Session Number: 9

## 2021-12-22 ENCOUNTER — Other Ambulatory Visit: Payer: Self-pay

## 2021-12-22 ENCOUNTER — Ambulatory Visit
Admission: RE | Admit: 2021-12-22 | Discharge: 2021-12-22 | Disposition: A | Discharge status: Home / Self Care | Payer: Medicare Other | Source: Ambulatory Visit | Attending: Radiation Oncology | Admitting: Radiation Oncology

## 2021-12-22 DIAGNOSIS — Z510 Encounter for antineoplastic radiation therapy: Secondary | ICD-10-CM | POA: Diagnosis not present

## 2021-12-22 DIAGNOSIS — C61 Malignant neoplasm of prostate: Secondary | ICD-10-CM | POA: Diagnosis not present

## 2021-12-22 DIAGNOSIS — Z191 Hormone sensitive malignancy status: Secondary | ICD-10-CM | POA: Diagnosis not present

## 2021-12-22 LAB — RAD ONC ARIA SESSION SUMMARY
Course Elapsed Days: 13
Plan Fractions Treated to Date: 10
Plan Prescribed Dose Per Fraction: 2.5 Gy
Plan Total Fractions Prescribed: 28
Plan Total Prescribed Dose: 70 Gy
Reference Point Dosage Given to Date: 25 Gy
Reference Point Session Dosage Given: 2.5 Gy
Session Number: 10

## 2021-12-23 ENCOUNTER — Ambulatory Visit
Admission: RE | Admit: 2021-12-23 | Discharge: 2021-12-23 | Disposition: A | Discharge status: Home / Self Care | Payer: Medicare Other | Source: Ambulatory Visit | Attending: Radiation Oncology | Admitting: Radiation Oncology

## 2021-12-23 ENCOUNTER — Other Ambulatory Visit: Payer: Self-pay

## 2021-12-23 DIAGNOSIS — Z191 Hormone sensitive malignancy status: Secondary | ICD-10-CM | POA: Diagnosis not present

## 2021-12-23 DIAGNOSIS — Z510 Encounter for antineoplastic radiation therapy: Secondary | ICD-10-CM | POA: Diagnosis not present

## 2021-12-23 DIAGNOSIS — C61 Malignant neoplasm of prostate: Secondary | ICD-10-CM | POA: Diagnosis not present

## 2021-12-23 LAB — RAD ONC ARIA SESSION SUMMARY
Course Elapsed Days: 14
Plan Fractions Treated to Date: 11
Plan Prescribed Dose Per Fraction: 2.5 Gy
Plan Total Fractions Prescribed: 28
Plan Total Prescribed Dose: 70 Gy
Reference Point Dosage Given to Date: 27.5 Gy
Reference Point Session Dosage Given: 2.5 Gy
Session Number: 11

## 2021-12-24 ENCOUNTER — Other Ambulatory Visit: Payer: Self-pay

## 2021-12-24 ENCOUNTER — Ambulatory Visit
Admission: RE | Admit: 2021-12-24 | Discharge: 2021-12-24 | Disposition: A | Discharge status: Home / Self Care | Payer: Medicare Other | Source: Ambulatory Visit | Attending: Radiation Oncology | Admitting: Radiation Oncology

## 2021-12-24 DIAGNOSIS — Z510 Encounter for antineoplastic radiation therapy: Secondary | ICD-10-CM | POA: Diagnosis not present

## 2021-12-24 DIAGNOSIS — C61 Malignant neoplasm of prostate: Secondary | ICD-10-CM | POA: Diagnosis not present

## 2021-12-24 DIAGNOSIS — Z191 Hormone sensitive malignancy status: Secondary | ICD-10-CM | POA: Diagnosis not present

## 2021-12-24 LAB — RAD ONC ARIA SESSION SUMMARY
Course Elapsed Days: 15
Plan Fractions Treated to Date: 12
Plan Prescribed Dose Per Fraction: 2.5 Gy
Plan Total Fractions Prescribed: 28
Plan Total Prescribed Dose: 70 Gy
Reference Point Dosage Given to Date: 30 Gy
Reference Point Session Dosage Given: 2.5 Gy
Session Number: 12

## 2021-12-25 ENCOUNTER — Other Ambulatory Visit: Payer: Self-pay

## 2021-12-25 ENCOUNTER — Ambulatory Visit
Admission: RE | Admit: 2021-12-25 | Discharge: 2021-12-25 | Disposition: A | Discharge status: Home / Self Care | Payer: Medicare Other | Source: Ambulatory Visit | Attending: Radiation Oncology | Admitting: Radiation Oncology

## 2021-12-25 DIAGNOSIS — Z510 Encounter for antineoplastic radiation therapy: Secondary | ICD-10-CM | POA: Diagnosis not present

## 2021-12-25 DIAGNOSIS — C61 Malignant neoplasm of prostate: Secondary | ICD-10-CM | POA: Diagnosis not present

## 2021-12-25 DIAGNOSIS — Z191 Hormone sensitive malignancy status: Secondary | ICD-10-CM | POA: Diagnosis not present

## 2021-12-25 LAB — RAD ONC ARIA SESSION SUMMARY
Course Elapsed Days: 16
Plan Fractions Treated to Date: 13
Plan Prescribed Dose Per Fraction: 2.5 Gy
Plan Total Fractions Prescribed: 28
Plan Total Prescribed Dose: 70 Gy
Reference Point Dosage Given to Date: 32.5 Gy
Reference Point Session Dosage Given: 2.5 Gy
Session Number: 13

## 2021-12-26 ENCOUNTER — Ambulatory Visit
Admission: RE | Admit: 2021-12-26 | Discharge: 2021-12-26 | Disposition: A | Discharge status: Home / Self Care | Payer: Medicare Other | Source: Ambulatory Visit | Attending: Radiation Oncology | Admitting: Radiation Oncology

## 2021-12-26 ENCOUNTER — Other Ambulatory Visit: Payer: Self-pay

## 2021-12-26 DIAGNOSIS — C61 Malignant neoplasm of prostate: Secondary | ICD-10-CM | POA: Diagnosis not present

## 2021-12-26 DIAGNOSIS — Z191 Hormone sensitive malignancy status: Secondary | ICD-10-CM | POA: Diagnosis not present

## 2021-12-26 DIAGNOSIS — Z510 Encounter for antineoplastic radiation therapy: Secondary | ICD-10-CM | POA: Diagnosis not present

## 2021-12-26 LAB — RAD ONC ARIA SESSION SUMMARY
Course Elapsed Days: 17
Plan Fractions Treated to Date: 14
Plan Prescribed Dose Per Fraction: 2.5 Gy
Plan Total Fractions Prescribed: 28
Plan Total Prescribed Dose: 70 Gy
Reference Point Dosage Given to Date: 35 Gy
Reference Point Session Dosage Given: 2.5 Gy
Session Number: 14

## 2021-12-29 ENCOUNTER — Ambulatory Visit: Payer: Medicare Other

## 2021-12-30 ENCOUNTER — Ambulatory Visit
Admission: RE | Admit: 2021-12-30 | Discharge: 2021-12-30 | Disposition: A | Discharge status: Home / Self Care | Payer: Medicare Other | Source: Ambulatory Visit | Attending: Radiation Oncology | Admitting: Radiation Oncology

## 2021-12-30 ENCOUNTER — Ambulatory Visit: Payer: Medicare Other

## 2021-12-30 ENCOUNTER — Other Ambulatory Visit: Payer: Self-pay

## 2021-12-30 DIAGNOSIS — Z191 Hormone sensitive malignancy status: Secondary | ICD-10-CM | POA: Diagnosis not present

## 2021-12-30 DIAGNOSIS — Z510 Encounter for antineoplastic radiation therapy: Secondary | ICD-10-CM | POA: Diagnosis not present

## 2021-12-30 DIAGNOSIS — C61 Malignant neoplasm of prostate: Secondary | ICD-10-CM | POA: Diagnosis present

## 2021-12-30 LAB — RAD ONC ARIA SESSION SUMMARY
Course Elapsed Days: 21
Plan Fractions Treated to Date: 15
Plan Prescribed Dose Per Fraction: 2.5 Gy
Plan Total Fractions Prescribed: 28
Plan Total Prescribed Dose: 70 Gy
Reference Point Dosage Given to Date: 37.5 Gy
Reference Point Session Dosage Given: 2.5 Gy
Session Number: 15

## 2021-12-31 ENCOUNTER — Other Ambulatory Visit: Payer: Self-pay

## 2021-12-31 ENCOUNTER — Ambulatory Visit
Admission: RE | Admit: 2021-12-31 | Discharge: 2021-12-31 | Disposition: A | Discharge status: Home / Self Care | Payer: Medicare Other | Source: Ambulatory Visit | Attending: Radiation Oncology | Admitting: Radiation Oncology

## 2021-12-31 DIAGNOSIS — C61 Malignant neoplasm of prostate: Secondary | ICD-10-CM | POA: Diagnosis not present

## 2021-12-31 DIAGNOSIS — Z510 Encounter for antineoplastic radiation therapy: Secondary | ICD-10-CM | POA: Diagnosis not present

## 2021-12-31 DIAGNOSIS — Z191 Hormone sensitive malignancy status: Secondary | ICD-10-CM | POA: Diagnosis not present

## 2021-12-31 LAB — RAD ONC ARIA SESSION SUMMARY
Course Elapsed Days: 22
Plan Fractions Treated to Date: 16
Plan Prescribed Dose Per Fraction: 2.5 Gy
Plan Total Fractions Prescribed: 28
Plan Total Prescribed Dose: 70 Gy
Reference Point Dosage Given to Date: 40 Gy
Reference Point Session Dosage Given: 2.5 Gy
Session Number: 16

## 2022-01-01 ENCOUNTER — Other Ambulatory Visit: Payer: Self-pay

## 2022-01-01 ENCOUNTER — Ambulatory Visit
Admission: RE | Admit: 2022-01-01 | Discharge: 2022-01-01 | Disposition: A | Discharge status: Home / Self Care | Payer: Medicare Other | Source: Ambulatory Visit | Attending: Radiation Oncology | Admitting: Radiation Oncology

## 2022-01-01 DIAGNOSIS — Z510 Encounter for antineoplastic radiation therapy: Secondary | ICD-10-CM | POA: Diagnosis not present

## 2022-01-01 DIAGNOSIS — C61 Malignant neoplasm of prostate: Secondary | ICD-10-CM | POA: Diagnosis not present

## 2022-01-01 DIAGNOSIS — Z191 Hormone sensitive malignancy status: Secondary | ICD-10-CM | POA: Diagnosis not present

## 2022-01-01 LAB — RAD ONC ARIA SESSION SUMMARY
Course Elapsed Days: 23
Plan Fractions Treated to Date: 17
Plan Prescribed Dose Per Fraction: 2.5 Gy
Plan Total Fractions Prescribed: 28
Plan Total Prescribed Dose: 70 Gy
Reference Point Dosage Given to Date: 42.5 Gy
Reference Point Session Dosage Given: 2.5 Gy
Session Number: 17

## 2022-01-02 ENCOUNTER — Other Ambulatory Visit: Payer: Self-pay

## 2022-01-02 ENCOUNTER — Ambulatory Visit
Admission: RE | Admit: 2022-01-02 | Discharge: 2022-01-02 | Disposition: A | Discharge status: Home / Self Care | Payer: Medicare Other | Source: Ambulatory Visit | Attending: Radiation Oncology | Admitting: Radiation Oncology

## 2022-01-02 DIAGNOSIS — Z191 Hormone sensitive malignancy status: Secondary | ICD-10-CM | POA: Diagnosis not present

## 2022-01-02 DIAGNOSIS — Z510 Encounter for antineoplastic radiation therapy: Secondary | ICD-10-CM | POA: Diagnosis not present

## 2022-01-02 DIAGNOSIS — C61 Malignant neoplasm of prostate: Secondary | ICD-10-CM | POA: Diagnosis not present

## 2022-01-02 LAB — RAD ONC ARIA SESSION SUMMARY
Course Elapsed Days: 24
Plan Fractions Treated to Date: 18
Plan Prescribed Dose Per Fraction: 2.5 Gy
Plan Total Fractions Prescribed: 28
Plan Total Prescribed Dose: 70 Gy
Reference Point Dosage Given to Date: 45 Gy
Reference Point Session Dosage Given: 2.5 Gy
Session Number: 18

## 2022-01-05 ENCOUNTER — Other Ambulatory Visit: Payer: Self-pay

## 2022-01-05 ENCOUNTER — Ambulatory Visit
Admission: RE | Admit: 2022-01-05 | Discharge: 2022-01-05 | Disposition: A | Discharge status: Home / Self Care | Payer: Medicare Other | Source: Ambulatory Visit | Attending: Radiation Oncology | Admitting: Radiation Oncology

## 2022-01-05 DIAGNOSIS — Z510 Encounter for antineoplastic radiation therapy: Secondary | ICD-10-CM | POA: Diagnosis not present

## 2022-01-05 DIAGNOSIS — C61 Malignant neoplasm of prostate: Secondary | ICD-10-CM | POA: Diagnosis not present

## 2022-01-05 DIAGNOSIS — Z191 Hormone sensitive malignancy status: Secondary | ICD-10-CM | POA: Diagnosis not present

## 2022-01-05 LAB — RAD ONC ARIA SESSION SUMMARY
Course Elapsed Days: 27
Plan Fractions Treated to Date: 19
Plan Prescribed Dose Per Fraction: 2.5 Gy
Plan Total Fractions Prescribed: 28
Plan Total Prescribed Dose: 70 Gy
Reference Point Dosage Given to Date: 47.5 Gy
Reference Point Session Dosage Given: 2.5 Gy
Session Number: 19

## 2022-01-06 ENCOUNTER — Other Ambulatory Visit: Payer: Self-pay

## 2022-01-06 ENCOUNTER — Ambulatory Visit
Admission: RE | Admit: 2022-01-06 | Discharge: 2022-01-06 | Disposition: A | Discharge status: Home / Self Care | Payer: Medicare Other | Source: Ambulatory Visit | Attending: Radiation Oncology | Admitting: Radiation Oncology

## 2022-01-06 DIAGNOSIS — Z510 Encounter for antineoplastic radiation therapy: Secondary | ICD-10-CM | POA: Diagnosis not present

## 2022-01-06 DIAGNOSIS — Z191 Hormone sensitive malignancy status: Secondary | ICD-10-CM | POA: Diagnosis not present

## 2022-01-06 DIAGNOSIS — C61 Malignant neoplasm of prostate: Secondary | ICD-10-CM | POA: Diagnosis not present

## 2022-01-06 LAB — RAD ONC ARIA SESSION SUMMARY
Course Elapsed Days: 28
Plan Fractions Treated to Date: 20
Plan Prescribed Dose Per Fraction: 2.5 Gy
Plan Total Fractions Prescribed: 28
Plan Total Prescribed Dose: 70 Gy
Reference Point Dosage Given to Date: 50 Gy
Reference Point Session Dosage Given: 2.5 Gy
Session Number: 20

## 2022-01-07 ENCOUNTER — Other Ambulatory Visit: Payer: Self-pay

## 2022-01-07 ENCOUNTER — Ambulatory Visit
Admission: RE | Admit: 2022-01-07 | Discharge: 2022-01-07 | Disposition: A | Discharge status: Home / Self Care | Payer: Medicare Other | Source: Ambulatory Visit | Attending: Radiation Oncology | Admitting: Radiation Oncology

## 2022-01-07 DIAGNOSIS — Z191 Hormone sensitive malignancy status: Secondary | ICD-10-CM | POA: Diagnosis not present

## 2022-01-07 DIAGNOSIS — C61 Malignant neoplasm of prostate: Secondary | ICD-10-CM | POA: Diagnosis not present

## 2022-01-07 DIAGNOSIS — Z510 Encounter for antineoplastic radiation therapy: Secondary | ICD-10-CM | POA: Diagnosis not present

## 2022-01-07 LAB — RAD ONC ARIA SESSION SUMMARY
Course Elapsed Days: 29
Plan Fractions Treated to Date: 21
Plan Prescribed Dose Per Fraction: 2.5 Gy
Plan Total Fractions Prescribed: 28
Plan Total Prescribed Dose: 70 Gy
Reference Point Dosage Given to Date: 52.5 Gy
Reference Point Session Dosage Given: 2.5 Gy
Session Number: 21

## 2022-01-08 ENCOUNTER — Ambulatory Visit
Admission: RE | Admit: 2022-01-08 | Discharge: 2022-01-08 | Disposition: A | Discharge status: Home / Self Care | Payer: Medicare Other | Source: Ambulatory Visit | Attending: Radiation Oncology | Admitting: Radiation Oncology

## 2022-01-08 ENCOUNTER — Other Ambulatory Visit: Payer: Self-pay

## 2022-01-08 DIAGNOSIS — Z510 Encounter for antineoplastic radiation therapy: Secondary | ICD-10-CM | POA: Diagnosis not present

## 2022-01-08 DIAGNOSIS — C61 Malignant neoplasm of prostate: Secondary | ICD-10-CM | POA: Diagnosis not present

## 2022-01-08 DIAGNOSIS — Z191 Hormone sensitive malignancy status: Secondary | ICD-10-CM | POA: Diagnosis not present

## 2022-01-08 LAB — RAD ONC ARIA SESSION SUMMARY
Course Elapsed Days: 30
Plan Fractions Treated to Date: 22
Plan Prescribed Dose Per Fraction: 2.5 Gy
Plan Total Fractions Prescribed: 28
Plan Total Prescribed Dose: 70 Gy
Reference Point Dosage Given to Date: 55 Gy
Reference Point Session Dosage Given: 2.5 Gy
Session Number: 22

## 2022-01-09 ENCOUNTER — Other Ambulatory Visit: Payer: Self-pay

## 2022-01-09 ENCOUNTER — Ambulatory Visit
Admission: RE | Admit: 2022-01-09 | Discharge: 2022-01-09 | Disposition: A | Discharge status: Home / Self Care | Payer: Medicare Other | Source: Ambulatory Visit | Attending: Radiation Oncology | Admitting: Radiation Oncology

## 2022-01-09 DIAGNOSIS — Z191 Hormone sensitive malignancy status: Secondary | ICD-10-CM | POA: Diagnosis not present

## 2022-01-09 DIAGNOSIS — C61 Malignant neoplasm of prostate: Secondary | ICD-10-CM | POA: Diagnosis not present

## 2022-01-09 DIAGNOSIS — Z510 Encounter for antineoplastic radiation therapy: Secondary | ICD-10-CM | POA: Diagnosis not present

## 2022-01-09 LAB — RAD ONC ARIA SESSION SUMMARY
Course Elapsed Days: 31
Plan Fractions Treated to Date: 23
Plan Prescribed Dose Per Fraction: 2.5 Gy
Plan Total Fractions Prescribed: 28
Plan Total Prescribed Dose: 70 Gy
Reference Point Dosage Given to Date: 57.5 Gy
Reference Point Session Dosage Given: 2.5 Gy
Session Number: 23

## 2022-01-12 ENCOUNTER — Other Ambulatory Visit: Payer: Self-pay

## 2022-01-12 ENCOUNTER — Ambulatory Visit
Admission: RE | Admit: 2022-01-12 | Discharge: 2022-01-12 | Disposition: A | Discharge status: Home / Self Care | Payer: Medicare Other | Source: Ambulatory Visit | Attending: Radiation Oncology | Admitting: Radiation Oncology

## 2022-01-12 DIAGNOSIS — C61 Malignant neoplasm of prostate: Secondary | ICD-10-CM | POA: Diagnosis not present

## 2022-01-12 DIAGNOSIS — Z191 Hormone sensitive malignancy status: Secondary | ICD-10-CM | POA: Diagnosis not present

## 2022-01-12 DIAGNOSIS — Z510 Encounter for antineoplastic radiation therapy: Secondary | ICD-10-CM | POA: Diagnosis not present

## 2022-01-12 LAB — RAD ONC ARIA SESSION SUMMARY
Course Elapsed Days: 34
Plan Fractions Treated to Date: 24
Plan Prescribed Dose Per Fraction: 2.5 Gy
Plan Total Fractions Prescribed: 28
Plan Total Prescribed Dose: 70 Gy
Reference Point Dosage Given to Date: 60 Gy
Reference Point Session Dosage Given: 2.5 Gy
Session Number: 24

## 2022-01-13 ENCOUNTER — Other Ambulatory Visit: Payer: Self-pay

## 2022-01-13 ENCOUNTER — Ambulatory Visit
Admission: RE | Admit: 2022-01-13 | Discharge: 2022-01-13 | Disposition: A | Discharge status: Home / Self Care | Payer: Medicare Other | Source: Ambulatory Visit | Attending: Radiation Oncology | Admitting: Radiation Oncology

## 2022-01-13 DIAGNOSIS — M25571 Pain in right ankle and joints of right foot: Secondary | ICD-10-CM | POA: Diagnosis not present

## 2022-01-13 DIAGNOSIS — Z191 Hormone sensitive malignancy status: Secondary | ICD-10-CM | POA: Diagnosis not present

## 2022-01-13 DIAGNOSIS — Z510 Encounter for antineoplastic radiation therapy: Secondary | ICD-10-CM | POA: Diagnosis not present

## 2022-01-13 DIAGNOSIS — M545 Low back pain, unspecified: Secondary | ICD-10-CM | POA: Diagnosis not present

## 2022-01-13 DIAGNOSIS — G8929 Other chronic pain: Secondary | ICD-10-CM | POA: Diagnosis not present

## 2022-01-13 DIAGNOSIS — M7918 Myalgia, other site: Secondary | ICD-10-CM | POA: Diagnosis not present

## 2022-01-13 DIAGNOSIS — M159 Polyosteoarthritis, unspecified: Secondary | ICD-10-CM | POA: Diagnosis not present

## 2022-01-13 DIAGNOSIS — Z79899 Other long term (current) drug therapy: Secondary | ICD-10-CM | POA: Diagnosis not present

## 2022-01-13 DIAGNOSIS — C61 Malignant neoplasm of prostate: Secondary | ICD-10-CM | POA: Diagnosis not present

## 2022-01-13 DIAGNOSIS — Z79891 Long term (current) use of opiate analgesic: Secondary | ICD-10-CM | POA: Diagnosis not present

## 2022-01-13 DIAGNOSIS — M25572 Pain in left ankle and joints of left foot: Secondary | ICD-10-CM | POA: Diagnosis not present

## 2022-01-13 LAB — RAD ONC ARIA SESSION SUMMARY
Course Elapsed Days: 35
Plan Fractions Treated to Date: 25
Plan Prescribed Dose Per Fraction: 2.5 Gy
Plan Total Fractions Prescribed: 28
Plan Total Prescribed Dose: 70 Gy
Reference Point Dosage Given to Date: 62.5 Gy
Reference Point Session Dosage Given: 2.5 Gy
Session Number: 25

## 2022-01-14 ENCOUNTER — Ambulatory Visit
Admission: RE | Admit: 2022-01-14 | Discharge: 2022-01-14 | Disposition: A | Discharge status: Home / Self Care | Payer: Medicare Other | Source: Ambulatory Visit | Attending: Radiation Oncology | Admitting: Radiation Oncology

## 2022-01-14 ENCOUNTER — Other Ambulatory Visit: Payer: Self-pay

## 2022-01-14 DIAGNOSIS — Z191 Hormone sensitive malignancy status: Secondary | ICD-10-CM | POA: Diagnosis not present

## 2022-01-14 DIAGNOSIS — Z510 Encounter for antineoplastic radiation therapy: Secondary | ICD-10-CM | POA: Diagnosis not present

## 2022-01-14 DIAGNOSIS — C61 Malignant neoplasm of prostate: Secondary | ICD-10-CM | POA: Diagnosis not present

## 2022-01-14 LAB — RAD ONC ARIA SESSION SUMMARY
Course Elapsed Days: 36
Plan Fractions Treated to Date: 26
Plan Prescribed Dose Per Fraction: 2.5 Gy
Plan Total Fractions Prescribed: 28
Plan Total Prescribed Dose: 70 Gy
Reference Point Dosage Given to Date: 65 Gy
Reference Point Session Dosage Given: 2.5 Gy
Session Number: 26

## 2022-01-15 ENCOUNTER — Ambulatory Visit
Admission: RE | Admit: 2022-01-15 | Discharge: 2022-01-15 | Disposition: A | Discharge status: Home / Self Care | Payer: Medicare Other | Source: Ambulatory Visit | Attending: Radiation Oncology | Admitting: Radiation Oncology

## 2022-01-15 ENCOUNTER — Other Ambulatory Visit: Payer: Self-pay

## 2022-01-15 ENCOUNTER — Ambulatory Visit: Payer: Medicare Other

## 2022-01-15 DIAGNOSIS — C61 Malignant neoplasm of prostate: Secondary | ICD-10-CM | POA: Diagnosis not present

## 2022-01-15 DIAGNOSIS — Z191 Hormone sensitive malignancy status: Secondary | ICD-10-CM | POA: Diagnosis not present

## 2022-01-15 DIAGNOSIS — Z510 Encounter for antineoplastic radiation therapy: Secondary | ICD-10-CM | POA: Diagnosis not present

## 2022-01-15 LAB — RAD ONC ARIA SESSION SUMMARY
Course Elapsed Days: 37
Plan Fractions Treated to Date: 27
Plan Prescribed Dose Per Fraction: 2.5 Gy
Plan Total Fractions Prescribed: 28
Plan Total Prescribed Dose: 70 Gy
Reference Point Dosage Given to Date: 67.5 Gy
Reference Point Session Dosage Given: 2.5 Gy
Session Number: 27

## 2022-01-16 ENCOUNTER — Ambulatory Visit
Admission: RE | Admit: 2022-01-16 | Discharge: 2022-01-16 | Disposition: A | Discharge status: Home / Self Care | Payer: Medicare Other | Source: Ambulatory Visit | Attending: Radiation Oncology | Admitting: Radiation Oncology

## 2022-01-16 ENCOUNTER — Other Ambulatory Visit: Payer: Self-pay

## 2022-01-16 ENCOUNTER — Encounter: Payer: Self-pay | Admitting: Urology

## 2022-01-16 DIAGNOSIS — Z191 Hormone sensitive malignancy status: Secondary | ICD-10-CM | POA: Diagnosis not present

## 2022-01-16 DIAGNOSIS — C61 Malignant neoplasm of prostate: Secondary | ICD-10-CM

## 2022-01-16 DIAGNOSIS — Z510 Encounter for antineoplastic radiation therapy: Secondary | ICD-10-CM | POA: Diagnosis not present

## 2022-01-16 LAB — RAD ONC ARIA SESSION SUMMARY
Course Elapsed Days: 38
Plan Fractions Treated to Date: 28
Plan Prescribed Dose Per Fraction: 2.5 Gy
Plan Total Fractions Prescribed: 28
Plan Total Prescribed Dose: 70 Gy
Reference Point Dosage Given to Date: 70 Gy
Reference Point Session Dosage Given: 2.5 Gy
Session Number: 28

## 2022-01-27 DIAGNOSIS — R7303 Prediabetes: Secondary | ICD-10-CM | POA: Diagnosis not present

## 2022-01-27 DIAGNOSIS — J449 Chronic obstructive pulmonary disease, unspecified: Secondary | ICD-10-CM | POA: Diagnosis not present

## 2022-01-27 DIAGNOSIS — M170 Bilateral primary osteoarthritis of knee: Secondary | ICD-10-CM | POA: Diagnosis not present

## 2022-01-27 DIAGNOSIS — E559 Vitamin D deficiency, unspecified: Secondary | ICD-10-CM | POA: Diagnosis not present

## 2022-01-27 DIAGNOSIS — K219 Gastro-esophageal reflux disease without esophagitis: Secondary | ICD-10-CM | POA: Diagnosis not present

## 2022-01-27 DIAGNOSIS — I10 Essential (primary) hypertension: Secondary | ICD-10-CM | POA: Diagnosis not present

## 2022-01-27 DIAGNOSIS — E782 Mixed hyperlipidemia: Secondary | ICD-10-CM | POA: Diagnosis not present

## 2022-02-13 DIAGNOSIS — Z79899 Other long term (current) drug therapy: Secondary | ICD-10-CM | POA: Diagnosis not present

## 2022-02-13 DIAGNOSIS — I10 Essential (primary) hypertension: Secondary | ICD-10-CM | POA: Diagnosis not present

## 2022-02-13 DIAGNOSIS — M25571 Pain in right ankle and joints of right foot: Secondary | ICD-10-CM | POA: Diagnosis not present

## 2022-02-13 DIAGNOSIS — M545 Low back pain, unspecified: Secondary | ICD-10-CM | POA: Diagnosis not present

## 2022-02-13 DIAGNOSIS — M7918 Myalgia, other site: Secondary | ICD-10-CM | POA: Diagnosis not present

## 2022-02-13 DIAGNOSIS — G8929 Other chronic pain: Secondary | ICD-10-CM | POA: Diagnosis not present

## 2022-02-13 DIAGNOSIS — Z79891 Long term (current) use of opiate analgesic: Secondary | ICD-10-CM | POA: Diagnosis not present

## 2022-02-13 DIAGNOSIS — M25572 Pain in left ankle and joints of left foot: Secondary | ICD-10-CM | POA: Diagnosis not present

## 2022-02-13 DIAGNOSIS — M159 Polyosteoarthritis, unspecified: Secondary | ICD-10-CM | POA: Diagnosis not present

## 2022-02-26 DIAGNOSIS — K219 Gastro-esophageal reflux disease without esophagitis: Secondary | ICD-10-CM | POA: Diagnosis not present

## 2022-02-26 DIAGNOSIS — Z23 Encounter for immunization: Secondary | ICD-10-CM | POA: Diagnosis not present

## 2022-02-26 DIAGNOSIS — Z0001 Encounter for general adult medical examination with abnormal findings: Secondary | ICD-10-CM | POA: Diagnosis not present

## 2022-02-26 DIAGNOSIS — E782 Mixed hyperlipidemia: Secondary | ICD-10-CM | POA: Diagnosis not present

## 2022-02-26 DIAGNOSIS — M170 Bilateral primary osteoarthritis of knee: Secondary | ICD-10-CM | POA: Diagnosis not present

## 2022-02-26 DIAGNOSIS — E559 Vitamin D deficiency, unspecified: Secondary | ICD-10-CM | POA: Diagnosis not present

## 2022-02-26 DIAGNOSIS — J449 Chronic obstructive pulmonary disease, unspecified: Secondary | ICD-10-CM | POA: Diagnosis not present

## 2022-02-26 DIAGNOSIS — I10 Essential (primary) hypertension: Secondary | ICD-10-CM | POA: Diagnosis not present

## 2022-02-26 DIAGNOSIS — R7303 Prediabetes: Secondary | ICD-10-CM | POA: Diagnosis not present

## 2022-03-04 ENCOUNTER — Encounter: Payer: Self-pay | Admitting: Urology

## 2022-03-04 NOTE — Progress Notes (Signed)
Telephone appointment. I verified patient's identity and began nursing interview. Patient is doing well. No issues/ discomfort reported at this time.  Meaningful use complete. I-PSS score of 2-mild. Flomax as directed. Urology appt- Pending January, 2024  Patient aware of 8:30am-03/05/22 telephone appointment w/ Ashlyn Bruning PA-C. I left my extension [phone] in case patient needs anything. Patient verbalized understanding.  Patient contact [phone]

## 2022-03-04 NOTE — Progress Notes (Signed)
  Radiation Oncology         [phone]) [phone] ________________________________  Name: Jason Pugh MRN: [id number]  Date: 01/16/2022  DOB: 12/18/1960  End of Treatment Note  Diagnosis:    61 y.o. gentleman with Stage T1c adenocarcinoma of the prostate with Gleason score of 3+3, and PSA of 4.89.      Indication for treatment:  Curative, Definitive Radiotherapy       Radiation treatment dates:   12/09/21 - 01/16/22  Site/dose:   The prostate was treated to 70 Gy in 28 fractions of 2.5 Gy  Beams/energy:   The patient was treated with IMRT using volumetric arc therapy delivering 6 MV X-rays to clockwise and counterclockwise circumferential arcs with a 90 degree collimator offset to avoid dose scalloping.  Image guidance was performed with daily cone beam CT prior to each fraction to align to gold markers in the prostate and assure proper bladder and rectal fill volumes.  Immobilization was achieved with BodyFix custom mold.  Narrative: The patient tolerated radiation treatment relatively well with only minor urinary irritation and modest fatigue.  He had increased nocturia 2-3x/night but denied dysuria, gross hematuria, straining to void or incontinence. He did not have any bowel issues.  Plan: The patient has completed radiation treatment. He will return to radiation oncology clinic for routine followup in one month. I advised him to call or return sooner if he has any questions or concerns related to his recovery or treatment. ________________________________  Sheral Apley. Tammi Klippel, M.D.

## 2022-03-04 NOTE — Progress Notes (Signed)
Radiation Oncology         [phone] ________________________________  Name: Jason Pugh MRN: [id number]  Date: 03/05/2022  DOB: [birth year]  Post Treatment Note  CC: Jason Mccreedy, MD  Festus Aloe, MD  Diagnosis:    61 y.o. gentleman with Stage T1c adenocarcinoma of the prostate with Gleason score of 3+3, and PSA of 4.89.   Interval Since Last Radiation:  7 weeks  12/09/21 - 01/16/22:  The prostate was treated to 70 Gy in 28 fractions of 2.5 Gy  Narrative:  I spoke with the patient to conduct his routine scheduled 1 month follow up visit via telephone to spare the patient unnecessary potential exposure in the healthcare setting during the current COVID-19 pandemic.  The patient was notified in advance and gave permission to proceed with this visit format.  He tolerated radiation treatment relatively well with only minor urinary irritation and modest fatigue.  He had increased nocturia 2-3x/night but denied dysuria, gross hematuria, straining to void or incontinence. He did not have any bowel issues.                                On review of systems, the patient states that he is doing very well in general.  His LUTS have pretty much returned to baseline at this point and he has continued taking Flomax daily as prescribed.  He specifically denies dysuria, gross hematuria, straining to void, incomplete bladder emptying or incontinence.  He reports a healthy appetite and is maintaining his weight.  He denies abdominal pain, nausea, vomiting, diarrhea or constipation.  His energy level continues to gradually improve and overall, he is quite pleased with his progress to date.  He had a recent follow-up visit with Dr. Junious Silk on 02/19/2022 with a previsit PSA that was decreased to 3.37.  ALLERGIES:  has No Known Allergies.  Meds: Current Outpatient Medications  Medication Sig Dispense Refill   albuterol (VENTOLIN HFA) 108 (90 Base) MCG/ACT inhaler 2 puffs 2 (two) times daily.      amLODipine (NORVASC) 5 MG tablet Take 5 mg by mouth daily.     DULoxetine (CYMBALTA) 60 MG capsule Take 60 mg by mouth daily.     esomeprazole (NEXIUM) 40 MG capsule Take 40 mg by mouth daily.     losartan-hydrochlorothiazide (HYZAAR) 100-25 MG tablet Take 1 tablet by mouth daily.     metoprolol succinate (TOPROL-XL) 100 MG 24 hr tablet Take 100 mg by mouth daily.     oxyCODONE (ROXICODONE) 15 MG immediate release tablet Take 15 mg by mouth 4 (four) times daily.     pravastatin (PRAVACHOL) 20 MG tablet Take 20 mg by mouth at bedtime.     tamsulosin (FLOMAX) 0.4 MG CAPS capsule Take 1 capsule (0.4 mg total) by mouth daily as needed (for urination). 30 capsule 3   No current facility-administered medications for this encounter.    Physical Findings:  vitals were not taken for this visit.  Pain Assessment Pain Score: 0-No pain/10 Unable to assess due to telephone follow-up visit format.  Lab Findings: Lab Results  Component Value Date   HGB 13.6 11/21/2021   HCT 40.0 11/21/2021     Radiographic Findings: No results found.  Impression/Plan: 1.  61 y.o. gentleman with Stage T1c adenocarcinoma of the prostate with Gleason score of 3+3, and PSA of 4.89.   He will continue to follow up with urology for ongoing PSA determinations and  had a recent follow-up visit with Dr. Junious Silk on 02/19/2022 and will be scheduled for his next follow-up in Jan. 2024. He understands what to expect with regards to PSA monitoring going forward. I will look forward to following his response to treatment via correspondence with urology, and would be happy to continue to participate in his care if clinically indicated.  He had 1 additional refill of Flomax that he recently filled and plans to complete but pending his LUTS do not return at the completion of that prescription, he will discontinue completely at that time.  I talked to the patient about what to expect in the future, including his risk for erectile  dysfunction and rectal bleeding. I encouraged him to call or return to the office if he has any questions regarding his previous radiation or possible radiation side effects. He was comfortable with this plan and will follow up as needed.     Nicholos Johns, PA-C

## 2022-03-05 ENCOUNTER — Ambulatory Visit
Admission: RE | Admit: 2022-03-05 | Discharge: 2022-03-05 | Disposition: A | Discharge status: Home / Self Care | Payer: Medicare Other | Source: Ambulatory Visit | Attending: Radiation Oncology | Admitting: Radiation Oncology

## 2022-03-05 DIAGNOSIS — C61 Malignant neoplasm of prostate: Secondary | ICD-10-CM | POA: Insufficient documentation

## 2022-03-14 DIAGNOSIS — G8929 Other chronic pain: Secondary | ICD-10-CM | POA: Diagnosis not present

## 2022-03-14 DIAGNOSIS — I10 Essential (primary) hypertension: Secondary | ICD-10-CM | POA: Diagnosis not present

## 2022-03-14 DIAGNOSIS — Z79899 Other long term (current) drug therapy: Secondary | ICD-10-CM | POA: Diagnosis not present

## 2022-03-14 DIAGNOSIS — M25571 Pain in right ankle and joints of right foot: Secondary | ICD-10-CM | POA: Diagnosis not present

## 2022-03-14 DIAGNOSIS — M25572 Pain in left ankle and joints of left foot: Secondary | ICD-10-CM | POA: Diagnosis not present

## 2022-03-14 DIAGNOSIS — M7918 Myalgia, other site: Secondary | ICD-10-CM | POA: Diagnosis not present

## 2022-03-14 DIAGNOSIS — Z79891 Long term (current) use of opiate analgesic: Secondary | ICD-10-CM | POA: Diagnosis not present

## 2022-03-14 DIAGNOSIS — M159 Polyosteoarthritis, unspecified: Secondary | ICD-10-CM | POA: Diagnosis not present

## 2022-03-14 DIAGNOSIS — M545 Low back pain, unspecified: Secondary | ICD-10-CM | POA: Diagnosis not present

## 2022-03-27 DIAGNOSIS — I10 Essential (primary) hypertension: Secondary | ICD-10-CM | POA: Diagnosis not present

## 2022-03-27 DIAGNOSIS — K219 Gastro-esophageal reflux disease without esophagitis: Secondary | ICD-10-CM | POA: Diagnosis not present

## 2022-03-27 DIAGNOSIS — J449 Chronic obstructive pulmonary disease, unspecified: Secondary | ICD-10-CM | POA: Diagnosis not present

## 2022-03-27 DIAGNOSIS — R7303 Prediabetes: Secondary | ICD-10-CM | POA: Diagnosis not present

## 2022-03-27 DIAGNOSIS — E559 Vitamin D deficiency, unspecified: Secondary | ICD-10-CM | POA: Diagnosis not present

## 2022-03-27 DIAGNOSIS — M170 Bilateral primary osteoarthritis of knee: Secondary | ICD-10-CM | POA: Diagnosis not present

## 2022-03-27 DIAGNOSIS — E782 Mixed hyperlipidemia: Secondary | ICD-10-CM | POA: Diagnosis not present

## 2022-04-11 DIAGNOSIS — M7918 Myalgia, other site: Secondary | ICD-10-CM | POA: Diagnosis not present

## 2022-04-11 DIAGNOSIS — M25571 Pain in right ankle and joints of right foot: Secondary | ICD-10-CM | POA: Diagnosis not present

## 2022-04-11 DIAGNOSIS — G8929 Other chronic pain: Secondary | ICD-10-CM | POA: Diagnosis not present

## 2022-04-11 DIAGNOSIS — M159 Polyosteoarthritis, unspecified: Secondary | ICD-10-CM | POA: Diagnosis not present

## 2022-04-11 DIAGNOSIS — M25572 Pain in left ankle and joints of left foot: Secondary | ICD-10-CM | POA: Diagnosis not present

## 2022-04-11 DIAGNOSIS — Z79891 Long term (current) use of opiate analgesic: Secondary | ICD-10-CM | POA: Diagnosis not present

## 2022-04-11 DIAGNOSIS — M545 Low back pain, unspecified: Secondary | ICD-10-CM | POA: Diagnosis not present

## 2022-04-11 DIAGNOSIS — Z79899 Other long term (current) drug therapy: Secondary | ICD-10-CM | POA: Diagnosis not present

## 2022-04-24 ENCOUNTER — Encounter: Payer: Self-pay | Admitting: *Deleted

## 2022-04-28 ENCOUNTER — Encounter: Payer: Self-pay | Admitting: *Deleted

## 2022-04-30 ENCOUNTER — Encounter: Payer: Self-pay | Admitting: *Deleted

## 2022-05-09 DIAGNOSIS — M25571 Pain in right ankle and joints of right foot: Secondary | ICD-10-CM | POA: Diagnosis not present

## 2022-05-09 DIAGNOSIS — M25572 Pain in left ankle and joints of left foot: Secondary | ICD-10-CM | POA: Diagnosis not present

## 2022-05-09 DIAGNOSIS — Z79891 Long term (current) use of opiate analgesic: Secondary | ICD-10-CM | POA: Diagnosis not present

## 2022-05-09 DIAGNOSIS — Z79899 Other long term (current) drug therapy: Secondary | ICD-10-CM | POA: Diagnosis not present

## 2022-05-09 DIAGNOSIS — G8929 Other chronic pain: Secondary | ICD-10-CM | POA: Diagnosis not present

## 2022-05-09 DIAGNOSIS — M159 Polyosteoarthritis, unspecified: Secondary | ICD-10-CM | POA: Diagnosis not present

## 2022-05-09 DIAGNOSIS — M545 Low back pain, unspecified: Secondary | ICD-10-CM | POA: Diagnosis not present

## 2022-05-09 DIAGNOSIS — M7918 Myalgia, other site: Secondary | ICD-10-CM | POA: Diagnosis not present

## 2022-05-14 ENCOUNTER — Inpatient Hospital Stay: Payer: Medicare Other | Admitting: *Deleted

## 2022-05-14 ENCOUNTER — Encounter: Payer: Self-pay | Admitting: *Deleted

## 2022-05-14 DIAGNOSIS — C61 Malignant neoplasm of prostate: Secondary | ICD-10-CM
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
## 2022-05-15 NOTE — Progress Notes (Signed)
Sent referral for colonoscopy.

## 2022-06-13 DIAGNOSIS — M545 Low back pain, unspecified: Secondary | ICD-10-CM | POA: Diagnosis not present

## 2022-06-13 DIAGNOSIS — M25572 Pain in left ankle and joints of left foot: Secondary | ICD-10-CM | POA: Diagnosis not present

## 2022-06-13 DIAGNOSIS — M7918 Myalgia, other site: Secondary | ICD-10-CM | POA: Diagnosis not present

## 2022-06-13 DIAGNOSIS — M159 Polyosteoarthritis, unspecified: Secondary | ICD-10-CM | POA: Diagnosis not present

## 2022-06-13 DIAGNOSIS — M25571 Pain in right ankle and joints of right foot: Secondary | ICD-10-CM | POA: Diagnosis not present

## 2022-06-13 DIAGNOSIS — G8929 Other chronic pain: Secondary | ICD-10-CM | POA: Diagnosis not present

## 2022-06-13 DIAGNOSIS — Z79891 Long term (current) use of opiate analgesic: Secondary | ICD-10-CM | POA: Diagnosis not present

## 2022-06-13 DIAGNOSIS — Z79899 Other long term (current) drug therapy: Secondary | ICD-10-CM | POA: Diagnosis not present

## 2022-07-11 DIAGNOSIS — M545 Low back pain, unspecified: Secondary | ICD-10-CM | POA: Diagnosis not present

## 2022-07-11 DIAGNOSIS — Z79891 Long term (current) use of opiate analgesic: Secondary | ICD-10-CM | POA: Diagnosis not present

## 2022-07-11 DIAGNOSIS — M25572 Pain in left ankle and joints of left foot: Secondary | ICD-10-CM | POA: Diagnosis not present

## 2022-07-11 DIAGNOSIS — Z79899 Other long term (current) drug therapy: Secondary | ICD-10-CM | POA: Diagnosis not present

## 2022-07-11 DIAGNOSIS — M7918 Myalgia, other site: Secondary | ICD-10-CM | POA: Diagnosis not present

## 2022-07-11 DIAGNOSIS — M25571 Pain in right ankle and joints of right foot: Secondary | ICD-10-CM | POA: Diagnosis not present

## 2022-07-11 DIAGNOSIS — G8929 Other chronic pain: Secondary | ICD-10-CM | POA: Diagnosis not present

## 2022-07-11 DIAGNOSIS — M159 Polyosteoarthritis, unspecified: Secondary | ICD-10-CM | POA: Diagnosis not present

## 2022-07-20 DIAGNOSIS — E782 Mixed hyperlipidemia: Secondary | ICD-10-CM | POA: Diagnosis not present

## 2022-07-20 DIAGNOSIS — J449 Chronic obstructive pulmonary disease, unspecified: Secondary | ICD-10-CM | POA: Diagnosis not present

## 2022-07-20 DIAGNOSIS — R7303 Prediabetes: Secondary | ICD-10-CM | POA: Diagnosis not present

## 2022-07-20 DIAGNOSIS — K219 Gastro-esophageal reflux disease without esophagitis: Secondary | ICD-10-CM | POA: Diagnosis not present

## 2022-07-20 DIAGNOSIS — I10 Essential (primary) hypertension: Secondary | ICD-10-CM | POA: Diagnosis not present

## 2022-07-20 DIAGNOSIS — E559 Vitamin D deficiency, unspecified: Secondary | ICD-10-CM | POA: Diagnosis not present

## 2022-07-20 DIAGNOSIS — M170 Bilateral primary osteoarthritis of knee: Secondary | ICD-10-CM | POA: Diagnosis not present

## 2022-08-08 DIAGNOSIS — M159 Polyosteoarthritis, unspecified: Secondary | ICD-10-CM | POA: Diagnosis not present

## 2022-08-08 DIAGNOSIS — Z79899 Other long term (current) drug therapy: Secondary | ICD-10-CM | POA: Diagnosis not present

## 2022-08-08 DIAGNOSIS — M25571 Pain in right ankle and joints of right foot: Secondary | ICD-10-CM | POA: Diagnosis not present

## 2022-08-08 DIAGNOSIS — G8929 Other chronic pain: Secondary | ICD-10-CM | POA: Diagnosis not present

## 2022-08-08 DIAGNOSIS — M25572 Pain in left ankle and joints of left foot: Secondary | ICD-10-CM | POA: Diagnosis not present

## 2022-08-08 DIAGNOSIS — M7918 Myalgia, other site: Secondary | ICD-10-CM | POA: Diagnosis not present

## 2022-08-08 DIAGNOSIS — M545 Low back pain, unspecified: Secondary | ICD-10-CM | POA: Diagnosis not present

## 2022-08-08 DIAGNOSIS — Z79891 Long term (current) use of opiate analgesic: Secondary | ICD-10-CM | POA: Diagnosis not present

## 2022-08-31 DIAGNOSIS — J301 Allergic rhinitis due to pollen: Secondary | ICD-10-CM | POA: Diagnosis not present

## 2022-08-31 DIAGNOSIS — J302 Other seasonal allergic rhinitis: Secondary | ICD-10-CM | POA: Diagnosis not present

## 2022-09-08 DIAGNOSIS — Z79891 Long term (current) use of opiate analgesic: Secondary | ICD-10-CM | POA: Diagnosis not present

## 2022-09-08 DIAGNOSIS — M25571 Pain in right ankle and joints of right foot: Secondary | ICD-10-CM | POA: Diagnosis not present

## 2022-09-08 DIAGNOSIS — M7918 Myalgia, other site: Secondary | ICD-10-CM | POA: Diagnosis not present

## 2022-09-08 DIAGNOSIS — Z79899 Other long term (current) drug therapy: Secondary | ICD-10-CM | POA: Diagnosis not present

## 2022-09-08 DIAGNOSIS — M545 Low back pain, unspecified: Secondary | ICD-10-CM | POA: Diagnosis not present

## 2022-09-08 DIAGNOSIS — M159 Polyosteoarthritis, unspecified: Secondary | ICD-10-CM | POA: Diagnosis not present

## 2022-09-08 DIAGNOSIS — M25572 Pain in left ankle and joints of left foot: Secondary | ICD-10-CM | POA: Diagnosis not present

## 2022-09-08 DIAGNOSIS — G8929 Other chronic pain: Secondary | ICD-10-CM | POA: Diagnosis not present

## 2022-09-29 DIAGNOSIS — I10 Essential (primary) hypertension: Secondary | ICD-10-CM | POA: Diagnosis not present

## 2022-09-29 DIAGNOSIS — E782 Mixed hyperlipidemia: Secondary | ICD-10-CM | POA: Diagnosis not present

## 2022-10-06 DIAGNOSIS — M159 Polyosteoarthritis, unspecified: Secondary | ICD-10-CM | POA: Diagnosis not present

## 2022-10-06 DIAGNOSIS — M25571 Pain in right ankle and joints of right foot: Secondary | ICD-10-CM | POA: Diagnosis not present

## 2022-10-06 DIAGNOSIS — M25572 Pain in left ankle and joints of left foot: Secondary | ICD-10-CM | POA: Diagnosis not present

## 2022-10-06 DIAGNOSIS — M7918 Myalgia, other site: Secondary | ICD-10-CM | POA: Diagnosis not present

## 2022-10-06 DIAGNOSIS — M545 Low back pain, unspecified: Secondary | ICD-10-CM | POA: Diagnosis not present

## 2022-10-06 DIAGNOSIS — Z79891 Long term (current) use of opiate analgesic: Secondary | ICD-10-CM | POA: Diagnosis not present

## 2022-10-06 DIAGNOSIS — G8929 Other chronic pain: Secondary | ICD-10-CM | POA: Diagnosis not present

## 2022-10-06 DIAGNOSIS — Z79899 Other long term (current) drug therapy: Secondary | ICD-10-CM | POA: Diagnosis not present

## 2022-10-30 DIAGNOSIS — E782 Mixed hyperlipidemia: Secondary | ICD-10-CM | POA: Diagnosis not present

## 2022-10-30 DIAGNOSIS — I10 Essential (primary) hypertension: Secondary | ICD-10-CM | POA: Diagnosis not present

## 2022-11-06 DIAGNOSIS — M25572 Pain in left ankle and joints of left foot: Secondary | ICD-10-CM | POA: Diagnosis not present

## 2022-11-06 DIAGNOSIS — Z79899 Other long term (current) drug therapy: Secondary | ICD-10-CM | POA: Diagnosis not present

## 2022-11-06 DIAGNOSIS — M545 Low back pain, unspecified: Secondary | ICD-10-CM | POA: Diagnosis not present

## 2022-11-06 DIAGNOSIS — M159 Polyosteoarthritis, unspecified: Secondary | ICD-10-CM | POA: Diagnosis not present

## 2022-11-06 DIAGNOSIS — G8929 Other chronic pain: Secondary | ICD-10-CM | POA: Diagnosis not present

## 2022-11-06 DIAGNOSIS — M7918 Myalgia, other site: Secondary | ICD-10-CM | POA: Diagnosis not present

## 2022-11-06 DIAGNOSIS — M25571 Pain in right ankle and joints of right foot: Secondary | ICD-10-CM | POA: Diagnosis not present

## 2022-11-06 DIAGNOSIS — Z79891 Long term (current) use of opiate analgesic: Secondary | ICD-10-CM | POA: Diagnosis not present

## 2022-11-16 DIAGNOSIS — J449 Chronic obstructive pulmonary disease, unspecified: Secondary | ICD-10-CM | POA: Diagnosis not present

## 2022-11-16 DIAGNOSIS — Z0001 Encounter for general adult medical examination with abnormal findings: Secondary | ICD-10-CM | POA: Diagnosis not present

## 2022-11-16 DIAGNOSIS — E782 Mixed hyperlipidemia: Secondary | ICD-10-CM | POA: Diagnosis not present

## 2022-11-16 DIAGNOSIS — E559 Vitamin D deficiency, unspecified: Secondary | ICD-10-CM | POA: Diagnosis not present

## 2022-11-16 DIAGNOSIS — Z131 Encounter for screening for diabetes mellitus: Secondary | ICD-10-CM | POA: Diagnosis not present

## 2022-11-16 DIAGNOSIS — J302 Other seasonal allergic rhinitis: Secondary | ICD-10-CM | POA: Diagnosis not present

## 2022-11-16 DIAGNOSIS — J301 Allergic rhinitis due to pollen: Secondary | ICD-10-CM | POA: Diagnosis not present

## 2022-11-16 DIAGNOSIS — R7303 Prediabetes: Secondary | ICD-10-CM | POA: Diagnosis not present

## 2022-11-16 DIAGNOSIS — K219 Gastro-esophageal reflux disease without esophagitis: Secondary | ICD-10-CM | POA: Diagnosis not present

## 2022-11-16 DIAGNOSIS — M170 Bilateral primary osteoarthritis of knee: Secondary | ICD-10-CM | POA: Diagnosis not present

## 2022-11-16 DIAGNOSIS — I10 Essential (primary) hypertension: Secondary | ICD-10-CM | POA: Diagnosis not present

## 2022-12-05 DIAGNOSIS — Z79899 Other long term (current) drug therapy: Secondary | ICD-10-CM | POA: Diagnosis not present

## 2022-12-05 DIAGNOSIS — M7918 Myalgia, other site: Secondary | ICD-10-CM | POA: Diagnosis not present

## 2022-12-05 DIAGNOSIS — Z79891 Long term (current) use of opiate analgesic: Secondary | ICD-10-CM | POA: Diagnosis not present

## 2022-12-05 DIAGNOSIS — M25572 Pain in left ankle and joints of left foot: Secondary | ICD-10-CM | POA: Diagnosis not present

## 2022-12-05 DIAGNOSIS — M159 Polyosteoarthritis, unspecified: Secondary | ICD-10-CM | POA: Diagnosis not present

## 2022-12-05 DIAGNOSIS — M545 Low back pain, unspecified: Secondary | ICD-10-CM | POA: Diagnosis not present

## 2022-12-05 DIAGNOSIS — G8929 Other chronic pain: Secondary | ICD-10-CM | POA: Diagnosis not present

## 2022-12-05 DIAGNOSIS — M25571 Pain in right ankle and joints of right foot: Secondary | ICD-10-CM | POA: Diagnosis not present

## 2022-12-28 DIAGNOSIS — E782 Mixed hyperlipidemia: Secondary | ICD-10-CM | POA: Diagnosis not present

## 2022-12-28 DIAGNOSIS — R7303 Prediabetes: Secondary | ICD-10-CM | POA: Diagnosis not present

## 2022-12-28 DIAGNOSIS — J301 Allergic rhinitis due to pollen: Secondary | ICD-10-CM | POA: Diagnosis not present

## 2022-12-28 DIAGNOSIS — J449 Chronic obstructive pulmonary disease, unspecified: Secondary | ICD-10-CM | POA: Diagnosis not present

## 2022-12-28 DIAGNOSIS — I10 Essential (primary) hypertension: Secondary | ICD-10-CM | POA: Diagnosis not present

## 2022-12-28 DIAGNOSIS — E559 Vitamin D deficiency, unspecified: Secondary | ICD-10-CM | POA: Diagnosis not present

## 2022-12-28 DIAGNOSIS — M170 Bilateral primary osteoarthritis of knee: Secondary | ICD-10-CM | POA: Diagnosis not present

## 2022-12-28 DIAGNOSIS — K219 Gastro-esophageal reflux disease without esophagitis: Secondary | ICD-10-CM | POA: Diagnosis not present

## 2022-12-28 DIAGNOSIS — D649 Anemia, unspecified: Secondary | ICD-10-CM | POA: Diagnosis not present

## 2022-12-28 DIAGNOSIS — J302 Other seasonal allergic rhinitis: Secondary | ICD-10-CM | POA: Diagnosis not present

## 2023-01-06 DIAGNOSIS — M7918 Myalgia, other site: Secondary | ICD-10-CM | POA: Diagnosis not present

## 2023-01-06 DIAGNOSIS — M25572 Pain in left ankle and joints of left foot: Secondary | ICD-10-CM | POA: Diagnosis not present

## 2023-01-06 DIAGNOSIS — M159 Polyosteoarthritis, unspecified: Secondary | ICD-10-CM | POA: Diagnosis not present

## 2023-01-06 DIAGNOSIS — G8929 Other chronic pain: Secondary | ICD-10-CM | POA: Diagnosis not present

## 2023-01-06 DIAGNOSIS — Z79899 Other long term (current) drug therapy: Secondary | ICD-10-CM | POA: Diagnosis not present

## 2023-01-06 DIAGNOSIS — Z79891 Long term (current) use of opiate analgesic: Secondary | ICD-10-CM | POA: Diagnosis not present

## 2023-01-06 DIAGNOSIS — M25571 Pain in right ankle and joints of right foot: Secondary | ICD-10-CM | POA: Diagnosis not present

## 2023-01-06 DIAGNOSIS — M545 Low back pain, unspecified: Secondary | ICD-10-CM | POA: Diagnosis not present

## 2023-01-29 IMAGING — MR MR PROSTATE WO/W CM
12 series · 48 of 48 positions shown · IV contrast (multihance)
Comparison: None.

CLINICAL DATA: Prostate carcinoma, Gleason score 6. Active
surveillance.

EXAM:
MR PROSTATE WITHOUT AND WITH CONTRAST
TECHNIQUE: Multiplanar multisequence MRI images were obtained of the pelvis
centered about the prostate. Pre and post contrast images were
obtained.
CONTRAST:  20mL MULTIHANCE GADOBENATE DIMEGLUMINE 529 MG/ML IV SOLN

[Series 3: T2 · coronal · 3.0mm · 0.56mm/px · 1 of 23 slices shown (1 of 3)]
[im 1/23]
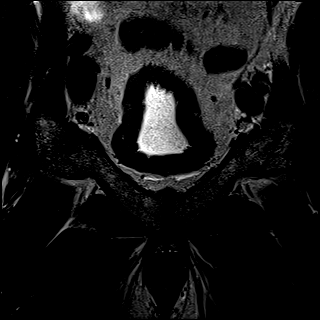

[Series 4: T1 · axial · 5.0mm · 1.25mm/px · 1 of 80 slices shown]
[im 1/80]
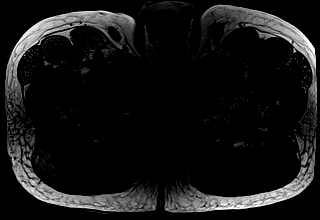

[Series 5: DWI · axial · 3.0mm · 2.11mm/px · 1 of 75 slices shown (1 of 3)]
[im 1/75]
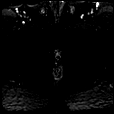

[Series 6: DWI · axial · 3.0mm · 2.11mm/px · 1 of 25 slices shown (2 of 3)]
[im 1/25]
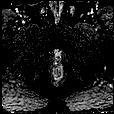

[Series 7: DWI · axial · 3.0mm · 2.11mm/px · 1 of 25 slices shown (3 of 3)]
[im 1/25]
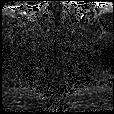

[Series 8: T2 · axial · 3.0mm · 0.62mm/px · 1 of 25 slices shown (2 of 3)]
[im 1/25]
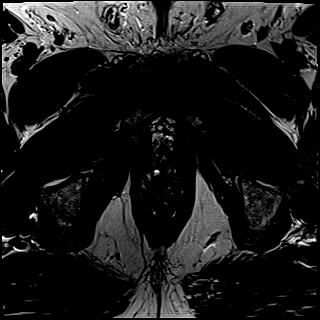

[Series 9: T2 · axial · 1.0mm · 1.04mm/px · z∈[+79,+150]mm · 2 of 72 slices shown (3 of 3)]
[im 1/72]
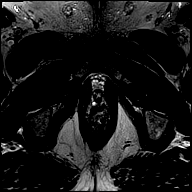
[im 72/72]
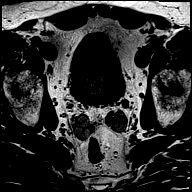

[Series 10: pre t1_twist_tra_dyn · axial · non-contrast · 3.5mm · 1.04mm/px · 1 of 22 slices shown]
[im 1/22]
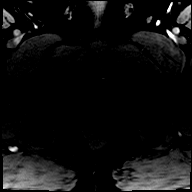

[Series 11: post t1_twist_tra_dyn-copy center · axial · non-contrast · 3.5mm · 1.04mm/px · z∈[+78,+152]mm · 18 of 660 slices shown]
[im 1/660]
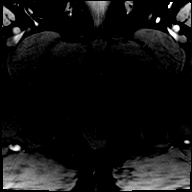
[im 39/660]
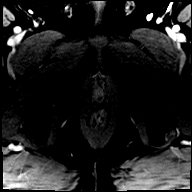
[im 78/660]
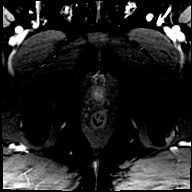
[im 117/660]
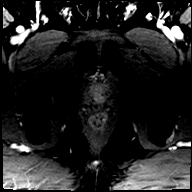
[im 156/660]
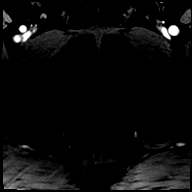
[im 194/660]
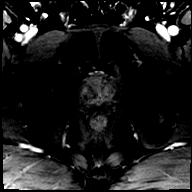
[im 233/660]
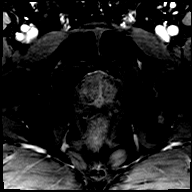
[im 272/660]
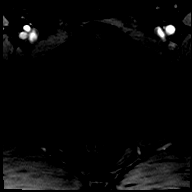
[im 311/660]
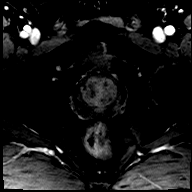
[im 349/660]
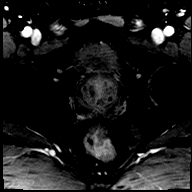
[im 388/660]
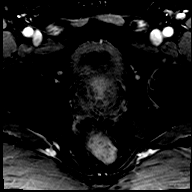
[im 427/660]
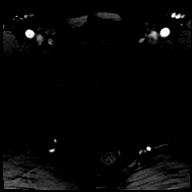
[im 466/660]
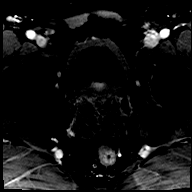
[im 504/660]
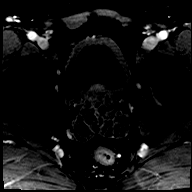
[im 543/660]
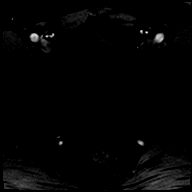
[im 582/660]
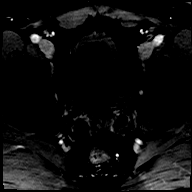
[im 621/660]
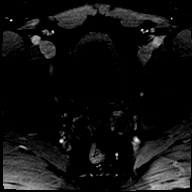
[im 660/660]
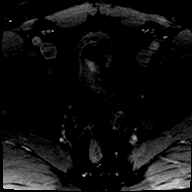

[Series 12: post t1_twist_tra_dyn-copy cent_sub · axial · 3.5mm · 1.04mm/px · z∈[+78,+152]mm · 17 of 638 slices shown]
[im 1/638]
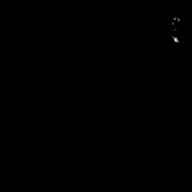
[im 40/638]
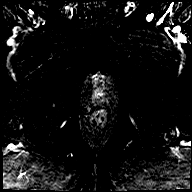
[im 80/638]
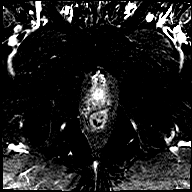
[im 120/638]
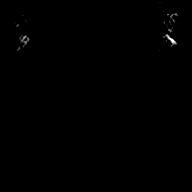
[im 160/638]
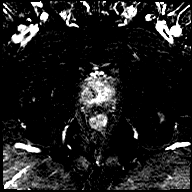
[im 200/638]
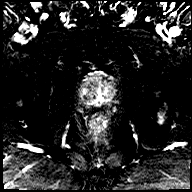
[im 239/638]
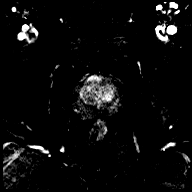
[im 279/638]
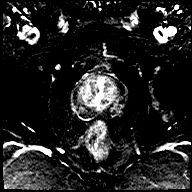
[im 319/638]
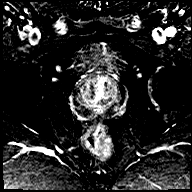
[im 359/638]
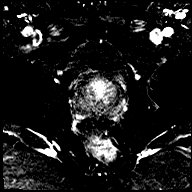
[im 399/638]
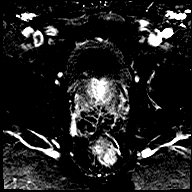
[im 438/638]
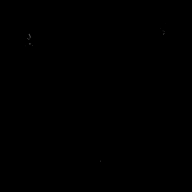
[im 478/638]
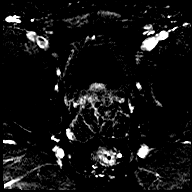
[im 518/638]
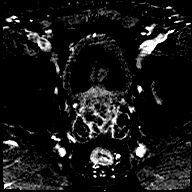
[im 558/638]
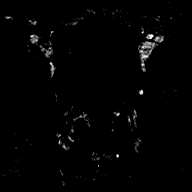
[im 598/638]
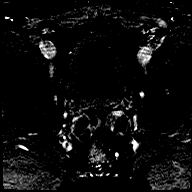
[im 638/638]
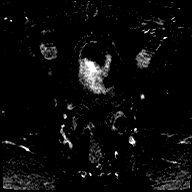

[Series 13: t1_vibe_dixon_tra_f · axial · 2.5mm · 0.91mm/px · z∈[+45,+243]mm · 2 of 80 slices shown]
[im 1/80]
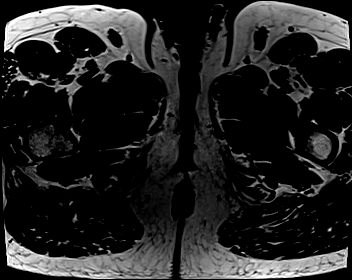
[im 80/80]
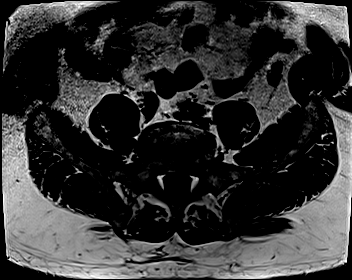

[Series 14: t1_vibe_dixon_tra_w · axial · 2.5mm · 0.91mm/px · z∈[+45,+243]mm · 2 of 80 slices shown]
[im 1/80]
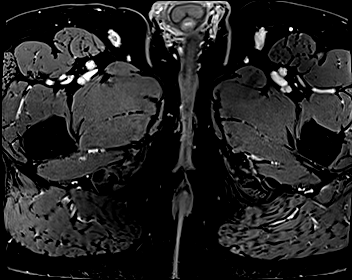
[im 80/80]
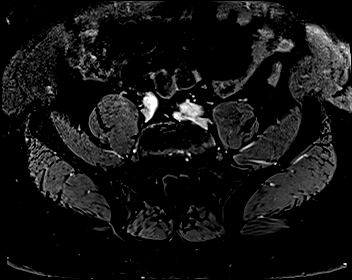

[48 of 48 positions shown; findings below may reference images not displayed]

FINDINGS: Prostate:

-- Peripheral Zone: Linear/wedge shaped hypointensities are noted on
ADC; however, no focal ADC hypointense or high b-value DWI
hyperintense nodules are identified.

-- Transition/Central Zone: Mild enlargement with small BPH nodules
noted. A non-circumscribed homogeneous moderately T2 hypointense
area is seen in the left anterior apical transition zone and
anterior fibromuscular stroma measuring 1.4 x 0.9 cm on image 61/9.
This area shows marked ADC hypointensity and DWI hyperintensity, as
well as early focal contrast enhancement. Irregularity of the
anterior capsular margin at this site is highly suggestive of
extracapsular extension. PI-RADS 5

A wedge-shaped area of moderate T2 hypointensity is seen between
nodules in the right anterior apical transition zone, which measures
1.4 by 0.7 cm on image 53/9. This area shows moderate ADC
hypointensity and DWI hyperintensity, and early focal contrast
enhancement. PI-RADS 3

-- Measurements/Volume:  5.3 by 4.5 x 5.7 cm (volume = 71 cm^3)

Transcapsular spread: Capsular bulge and irregularity at site of
nodule in the left anterior apical transition zone and fibromuscular
stroma highly suggestive of extracapsular extension.

Seminal vesicle involvement:  Absent

Neurovascular bundle involvement:  Absent

Pelvic adenopathy: None visualized

Bone metastasis: None visualized

Other:  None
IMPRESSION: 1.4 cm nodule in the left anterior apical transition zone and
fibromuscular stroma, highly suspicious for high-grade carcinoma
with probable extracapsular extension. PI-RADS 5 (v2.1): Very high
(clinically significant cancer highly likely)

1.4 cm nodule in the right anterior apical transition zone,
suspicious for high-grade carcinoma. PI-RADS 4 (v2.1): High
(clinically significant cancer likely)

No evidence of pelvic metastatic disease.

(I have post-processed this exam in the [employer] application for
potential fusion-guided biopsy.)

## 2023-02-05 DIAGNOSIS — M7918 Myalgia, other site: Secondary | ICD-10-CM | POA: Diagnosis not present

## 2023-02-05 DIAGNOSIS — Z79899 Other long term (current) drug therapy: Secondary | ICD-10-CM | POA: Diagnosis not present

## 2023-02-05 DIAGNOSIS — M545 Low back pain, unspecified: Secondary | ICD-10-CM | POA: Diagnosis not present

## 2023-02-05 DIAGNOSIS — M25572 Pain in left ankle and joints of left foot: Secondary | ICD-10-CM | POA: Diagnosis not present

## 2023-02-05 DIAGNOSIS — Z79891 Long term (current) use of opiate analgesic: Secondary | ICD-10-CM | POA: Diagnosis not present

## 2023-02-05 DIAGNOSIS — M25571 Pain in right ankle and joints of right foot: Secondary | ICD-10-CM | POA: Diagnosis not present

## 2023-02-05 DIAGNOSIS — G8929 Other chronic pain: Secondary | ICD-10-CM | POA: Diagnosis not present

## 2023-02-05 DIAGNOSIS — M159 Polyosteoarthritis, unspecified: Secondary | ICD-10-CM | POA: Diagnosis not present

## 2023-03-01 DIAGNOSIS — J302 Other seasonal allergic rhinitis: Secondary | ICD-10-CM | POA: Diagnosis not present

## 2023-03-01 DIAGNOSIS — I10 Essential (primary) hypertension: Secondary | ICD-10-CM | POA: Diagnosis not present

## 2023-03-01 DIAGNOSIS — J301 Allergic rhinitis due to pollen: Secondary | ICD-10-CM | POA: Diagnosis not present

## 2023-03-01 DIAGNOSIS — M170 Bilateral primary osteoarthritis of knee: Secondary | ICD-10-CM | POA: Diagnosis not present

## 2023-03-01 DIAGNOSIS — J449 Chronic obstructive pulmonary disease, unspecified: Secondary | ICD-10-CM | POA: Diagnosis not present

## 2023-03-01 DIAGNOSIS — K219 Gastro-esophageal reflux disease without esophagitis: Secondary | ICD-10-CM | POA: Diagnosis not present

## 2023-03-01 DIAGNOSIS — Z0001 Encounter for general adult medical examination with abnormal findings: Secondary | ICD-10-CM | POA: Diagnosis not present

## 2023-03-01 DIAGNOSIS — Z23 Encounter for immunization: Secondary | ICD-10-CM | POA: Diagnosis not present

## 2023-03-01 DIAGNOSIS — E559 Vitamin D deficiency, unspecified: Secondary | ICD-10-CM | POA: Diagnosis not present

## 2023-03-01 DIAGNOSIS — R7303 Prediabetes: Secondary | ICD-10-CM | POA: Diagnosis not present

## 2023-03-01 DIAGNOSIS — E782 Mixed hyperlipidemia: Secondary | ICD-10-CM | POA: Diagnosis not present

## 2023-03-06 DIAGNOSIS — M25572 Pain in left ankle and joints of left foot: Secondary | ICD-10-CM | POA: Diagnosis not present

## 2023-03-06 DIAGNOSIS — G8929 Other chronic pain: Secondary | ICD-10-CM | POA: Diagnosis not present

## 2023-03-06 DIAGNOSIS — M159 Polyosteoarthritis, unspecified: Secondary | ICD-10-CM | POA: Diagnosis not present

## 2023-03-06 DIAGNOSIS — Z79899 Other long term (current) drug therapy: Secondary | ICD-10-CM | POA: Diagnosis not present

## 2023-03-06 DIAGNOSIS — M25571 Pain in right ankle and joints of right foot: Secondary | ICD-10-CM | POA: Diagnosis not present

## 2023-03-06 DIAGNOSIS — M545 Low back pain, unspecified: Secondary | ICD-10-CM | POA: Diagnosis not present

## 2023-03-06 DIAGNOSIS — Z79891 Long term (current) use of opiate analgesic: Secondary | ICD-10-CM | POA: Diagnosis not present

## 2023-03-06 DIAGNOSIS — M7918 Myalgia, other site: Secondary | ICD-10-CM | POA: Diagnosis not present

## 2023-04-03 DIAGNOSIS — Z79899 Other long term (current) drug therapy: Secondary | ICD-10-CM | POA: Diagnosis not present

## 2023-04-03 DIAGNOSIS — M545 Low back pain, unspecified: Secondary | ICD-10-CM | POA: Diagnosis not present

## 2023-04-03 DIAGNOSIS — M25571 Pain in right ankle and joints of right foot: Secondary | ICD-10-CM | POA: Diagnosis not present

## 2023-04-03 DIAGNOSIS — G8929 Other chronic pain: Secondary | ICD-10-CM | POA: Diagnosis not present

## 2023-04-03 DIAGNOSIS — M25572 Pain in left ankle and joints of left foot: Secondary | ICD-10-CM | POA: Diagnosis not present

## 2023-04-03 DIAGNOSIS — M7918 Myalgia, other site: Secondary | ICD-10-CM | POA: Diagnosis not present

## 2023-04-03 DIAGNOSIS — M159 Polyosteoarthritis, unspecified: Secondary | ICD-10-CM | POA: Diagnosis not present

## 2023-04-05 DIAGNOSIS — M170 Bilateral primary osteoarthritis of knee: Secondary | ICD-10-CM | POA: Diagnosis not present

## 2023-04-05 DIAGNOSIS — I119 Hypertensive heart disease without heart failure: Secondary | ICD-10-CM | POA: Diagnosis not present

## 2023-04-05 DIAGNOSIS — K219 Gastro-esophageal reflux disease without esophagitis: Secondary | ICD-10-CM | POA: Diagnosis not present

## 2023-04-05 DIAGNOSIS — R809 Proteinuria, unspecified: Secondary | ICD-10-CM | POA: Diagnosis not present

## 2023-04-05 DIAGNOSIS — E559 Vitamin D deficiency, unspecified: Secondary | ICD-10-CM | POA: Diagnosis not present

## 2023-04-05 DIAGNOSIS — J302 Other seasonal allergic rhinitis: Secondary | ICD-10-CM | POA: Diagnosis not present

## 2023-04-05 DIAGNOSIS — R7303 Prediabetes: Secondary | ICD-10-CM | POA: Diagnosis not present

## 2023-04-05 DIAGNOSIS — J301 Allergic rhinitis due to pollen: Secondary | ICD-10-CM | POA: Diagnosis not present

## 2023-04-05 DIAGNOSIS — E782 Mixed hyperlipidemia: Secondary | ICD-10-CM | POA: Diagnosis not present

## 2023-04-05 DIAGNOSIS — J449 Chronic obstructive pulmonary disease, unspecified: Secondary | ICD-10-CM | POA: Diagnosis not present

## 2023-05-04 DIAGNOSIS — Z79891 Long term (current) use of opiate analgesic: Secondary | ICD-10-CM | POA: Diagnosis not present

## 2023-05-04 DIAGNOSIS — Z79899 Other long term (current) drug therapy: Secondary | ICD-10-CM | POA: Diagnosis not present

## 2023-05-04 DIAGNOSIS — M545 Low back pain, unspecified: Secondary | ICD-10-CM | POA: Diagnosis not present

## 2023-05-04 DIAGNOSIS — G8929 Other chronic pain: Secondary | ICD-10-CM | POA: Diagnosis not present

## 2023-05-04 DIAGNOSIS — M159 Polyosteoarthritis, unspecified: Secondary | ICD-10-CM | POA: Diagnosis not present

## 2023-05-04 DIAGNOSIS — M25572 Pain in left ankle and joints of left foot: Secondary | ICD-10-CM | POA: Diagnosis not present

## 2023-05-04 DIAGNOSIS — M25571 Pain in right ankle and joints of right foot: Secondary | ICD-10-CM | POA: Diagnosis not present

## 2023-05-04 DIAGNOSIS — M7918 Myalgia, other site: Secondary | ICD-10-CM | POA: Diagnosis not present

## 2023-06-04 DIAGNOSIS — M25572 Pain in left ankle and joints of left foot: Secondary | ICD-10-CM | POA: Diagnosis not present

## 2023-06-04 DIAGNOSIS — Z79891 Long term (current) use of opiate analgesic: Secondary | ICD-10-CM | POA: Diagnosis not present

## 2023-06-04 DIAGNOSIS — G8929 Other chronic pain: Secondary | ICD-10-CM | POA: Diagnosis not present

## 2023-06-04 DIAGNOSIS — M159 Polyosteoarthritis, unspecified: Secondary | ICD-10-CM | POA: Diagnosis not present

## 2023-06-04 DIAGNOSIS — Z79899 Other long term (current) drug therapy: Secondary | ICD-10-CM | POA: Diagnosis not present

## 2023-06-04 DIAGNOSIS — M545 Low back pain, unspecified: Secondary | ICD-10-CM | POA: Diagnosis not present

## 2023-06-04 DIAGNOSIS — M25571 Pain in right ankle and joints of right foot: Secondary | ICD-10-CM | POA: Diagnosis not present

## 2023-06-04 DIAGNOSIS — M7918 Myalgia, other site: Secondary | ICD-10-CM | POA: Diagnosis not present

## 2023-07-07 DIAGNOSIS — Z79899 Other long term (current) drug therapy: Secondary | ICD-10-CM | POA: Diagnosis not present

## 2023-07-07 DIAGNOSIS — M25572 Pain in left ankle and joints of left foot: Secondary | ICD-10-CM | POA: Diagnosis not present

## 2023-07-07 DIAGNOSIS — M159 Polyosteoarthritis, unspecified: Secondary | ICD-10-CM | POA: Diagnosis not present

## 2023-07-07 DIAGNOSIS — G8929 Other chronic pain: Secondary | ICD-10-CM | POA: Diagnosis not present

## 2023-07-07 DIAGNOSIS — M7918 Myalgia, other site: Secondary | ICD-10-CM | POA: Diagnosis not present

## 2023-07-07 DIAGNOSIS — Z79891 Long term (current) use of opiate analgesic: Secondary | ICD-10-CM | POA: Diagnosis not present

## 2023-07-07 DIAGNOSIS — M545 Low back pain, unspecified: Secondary | ICD-10-CM | POA: Diagnosis not present

## 2023-07-07 DIAGNOSIS — M25571 Pain in right ankle and joints of right foot: Secondary | ICD-10-CM | POA: Diagnosis not present

## 2023-08-04 DIAGNOSIS — M25572 Pain in left ankle and joints of left foot: Secondary | ICD-10-CM | POA: Diagnosis not present

## 2023-08-04 DIAGNOSIS — M159 Polyosteoarthritis, unspecified: Secondary | ICD-10-CM | POA: Diagnosis not present

## 2023-08-04 DIAGNOSIS — Z79899 Other long term (current) drug therapy: Secondary | ICD-10-CM | POA: Diagnosis not present

## 2023-08-04 DIAGNOSIS — G8929 Other chronic pain: Secondary | ICD-10-CM | POA: Diagnosis not present

## 2023-08-04 DIAGNOSIS — M7918 Myalgia, other site: Secondary | ICD-10-CM | POA: Diagnosis not present

## 2023-08-04 DIAGNOSIS — M545 Low back pain, unspecified: Secondary | ICD-10-CM | POA: Diagnosis not present

## 2023-08-04 DIAGNOSIS — Z79891 Long term (current) use of opiate analgesic: Secondary | ICD-10-CM | POA: Diagnosis not present

## 2023-08-04 DIAGNOSIS — M25571 Pain in right ankle and joints of right foot: Secondary | ICD-10-CM | POA: Diagnosis not present

## 2023-09-03 DIAGNOSIS — G8929 Other chronic pain: Secondary | ICD-10-CM | POA: Diagnosis not present

## 2023-09-03 DIAGNOSIS — M545 Low back pain, unspecified: Secondary | ICD-10-CM | POA: Diagnosis not present

## 2023-09-03 DIAGNOSIS — M7918 Myalgia, other site: Secondary | ICD-10-CM | POA: Diagnosis not present

## 2023-09-03 DIAGNOSIS — Z79899 Other long term (current) drug therapy: Secondary | ICD-10-CM | POA: Diagnosis not present

## 2023-09-03 DIAGNOSIS — Z79891 Long term (current) use of opiate analgesic: Secondary | ICD-10-CM | POA: Diagnosis not present

## 2023-09-03 DIAGNOSIS — M159 Polyosteoarthritis, unspecified: Secondary | ICD-10-CM | POA: Diagnosis not present

## 2023-09-03 DIAGNOSIS — M25571 Pain in right ankle and joints of right foot: Secondary | ICD-10-CM | POA: Diagnosis not present

## 2023-09-03 DIAGNOSIS — M25572 Pain in left ankle and joints of left foot: Secondary | ICD-10-CM | POA: Diagnosis not present

## 2023-09-06 DIAGNOSIS — E559 Vitamin D deficiency, unspecified: Secondary | ICD-10-CM | POA: Diagnosis not present

## 2023-09-06 DIAGNOSIS — K219 Gastro-esophageal reflux disease without esophagitis: Secondary | ICD-10-CM | POA: Diagnosis not present

## 2023-09-06 DIAGNOSIS — J449 Chronic obstructive pulmonary disease, unspecified: Secondary | ICD-10-CM | POA: Diagnosis not present

## 2023-09-06 DIAGNOSIS — R7303 Prediabetes: Secondary | ICD-10-CM | POA: Diagnosis not present

## 2023-09-06 DIAGNOSIS — I10 Essential (primary) hypertension: Secondary | ICD-10-CM | POA: Diagnosis not present

## 2023-09-06 DIAGNOSIS — M170 Bilateral primary osteoarthritis of knee: Secondary | ICD-10-CM | POA: Diagnosis not present

## 2023-09-06 DIAGNOSIS — J301 Allergic rhinitis due to pollen: Secondary | ICD-10-CM | POA: Diagnosis not present

## 2023-09-06 DIAGNOSIS — J302 Other seasonal allergic rhinitis: Secondary | ICD-10-CM | POA: Diagnosis not present

## 2023-09-06 DIAGNOSIS — E782 Mixed hyperlipidemia: Secondary | ICD-10-CM | POA: Diagnosis not present

## 2023-09-06 DIAGNOSIS — Z0001 Encounter for general adult medical examination with abnormal findings: Secondary | ICD-10-CM | POA: Diagnosis not present

## 2023-10-02 DIAGNOSIS — G8929 Other chronic pain: Secondary | ICD-10-CM | POA: Diagnosis not present

## 2023-10-02 DIAGNOSIS — Z79899 Other long term (current) drug therapy: Secondary | ICD-10-CM | POA: Diagnosis not present

## 2023-10-02 DIAGNOSIS — M7918 Myalgia, other site: Secondary | ICD-10-CM | POA: Diagnosis not present

## 2023-10-02 DIAGNOSIS — M159 Polyosteoarthritis, unspecified: Secondary | ICD-10-CM | POA: Diagnosis not present

## 2023-10-02 DIAGNOSIS — M545 Low back pain, unspecified: Secondary | ICD-10-CM | POA: Diagnosis not present

## 2023-10-04 DIAGNOSIS — J301 Allergic rhinitis due to pollen: Secondary | ICD-10-CM | POA: Diagnosis not present

## 2023-10-04 DIAGNOSIS — E559 Vitamin D deficiency, unspecified: Secondary | ICD-10-CM | POA: Diagnosis not present

## 2023-10-04 DIAGNOSIS — M170 Bilateral primary osteoarthritis of knee: Secondary | ICD-10-CM | POA: Diagnosis not present

## 2023-10-04 DIAGNOSIS — I10 Essential (primary) hypertension: Secondary | ICD-10-CM | POA: Diagnosis not present

## 2023-10-04 DIAGNOSIS — E1165 Type 2 diabetes mellitus with hyperglycemia: Secondary | ICD-10-CM | POA: Diagnosis not present

## 2023-10-04 DIAGNOSIS — E782 Mixed hyperlipidemia: Secondary | ICD-10-CM | POA: Diagnosis not present

## 2023-10-04 DIAGNOSIS — K219 Gastro-esophageal reflux disease without esophagitis: Secondary | ICD-10-CM | POA: Diagnosis not present

## 2023-10-04 DIAGNOSIS — J302 Other seasonal allergic rhinitis: Secondary | ICD-10-CM | POA: Diagnosis not present

## 2023-10-04 DIAGNOSIS — J449 Chronic obstructive pulmonary disease, unspecified: Secondary | ICD-10-CM | POA: Diagnosis not present

## 2023-10-15 DIAGNOSIS — B351 Tinea unguium: Secondary | ICD-10-CM | POA: Diagnosis not present

## 2023-10-15 DIAGNOSIS — E119 Type 2 diabetes mellitus without complications: Secondary | ICD-10-CM | POA: Diagnosis not present

## 2023-10-15 DIAGNOSIS — M2041 Other hammer toe(s) (acquired), right foot: Secondary | ICD-10-CM | POA: Diagnosis not present

## 2023-10-15 DIAGNOSIS — M2042 Other hammer toe(s) (acquired), left foot: Secondary | ICD-10-CM | POA: Diagnosis not present

## 2023-10-28 DIAGNOSIS — J449 Chronic obstructive pulmonary disease, unspecified: Secondary | ICD-10-CM | POA: Diagnosis not present

## 2023-10-28 DIAGNOSIS — K219 Gastro-esophageal reflux disease without esophagitis: Secondary | ICD-10-CM | POA: Diagnosis not present

## 2023-10-28 DIAGNOSIS — E559 Vitamin D deficiency, unspecified: Secondary | ICD-10-CM | POA: Diagnosis not present

## 2023-10-28 DIAGNOSIS — I10 Essential (primary) hypertension: Secondary | ICD-10-CM | POA: Diagnosis not present

## 2023-10-28 DIAGNOSIS — J302 Other seasonal allergic rhinitis: Secondary | ICD-10-CM | POA: Diagnosis not present

## 2023-10-28 DIAGNOSIS — M170 Bilateral primary osteoarthritis of knee: Secondary | ICD-10-CM | POA: Diagnosis not present

## 2023-10-28 DIAGNOSIS — E782 Mixed hyperlipidemia: Secondary | ICD-10-CM | POA: Diagnosis not present

## 2023-10-28 DIAGNOSIS — E1165 Type 2 diabetes mellitus with hyperglycemia: Secondary | ICD-10-CM | POA: Diagnosis not present

## 2023-10-28 DIAGNOSIS — J301 Allergic rhinitis due to pollen: Secondary | ICD-10-CM | POA: Diagnosis not present

## 2023-11-03 DIAGNOSIS — M25572 Pain in left ankle and joints of left foot: Secondary | ICD-10-CM | POA: Diagnosis not present

## 2023-11-03 DIAGNOSIS — M545 Low back pain, unspecified: Secondary | ICD-10-CM | POA: Diagnosis not present

## 2023-11-03 DIAGNOSIS — G8929 Other chronic pain: Secondary | ICD-10-CM | POA: Diagnosis not present

## 2023-11-03 DIAGNOSIS — M7918 Myalgia, other site: Secondary | ICD-10-CM | POA: Diagnosis not present

## 2023-11-03 DIAGNOSIS — M159 Polyosteoarthritis, unspecified: Secondary | ICD-10-CM | POA: Diagnosis not present

## 2023-11-03 DIAGNOSIS — M25571 Pain in right ankle and joints of right foot: Secondary | ICD-10-CM | POA: Diagnosis not present

## 2023-11-03 DIAGNOSIS — Z79899 Other long term (current) drug therapy: Secondary | ICD-10-CM | POA: Diagnosis not present

## 2023-11-30 DIAGNOSIS — M25571 Pain in right ankle and joints of right foot: Secondary | ICD-10-CM | POA: Diagnosis not present

## 2023-11-30 DIAGNOSIS — Z79891 Long term (current) use of opiate analgesic: Secondary | ICD-10-CM | POA: Diagnosis not present

## 2023-11-30 DIAGNOSIS — Z79899 Other long term (current) drug therapy: Secondary | ICD-10-CM | POA: Diagnosis not present

## 2023-11-30 DIAGNOSIS — M25572 Pain in left ankle and joints of left foot: Secondary | ICD-10-CM | POA: Diagnosis not present

## 2023-11-30 DIAGNOSIS — G8929 Other chronic pain: Secondary | ICD-10-CM | POA: Diagnosis not present

## 2023-11-30 DIAGNOSIS — M545 Low back pain, unspecified: Secondary | ICD-10-CM | POA: Diagnosis not present

## 2023-11-30 DIAGNOSIS — M7918 Myalgia, other site: Secondary | ICD-10-CM | POA: Diagnosis not present

## 2023-11-30 DIAGNOSIS — M159 Polyosteoarthritis, unspecified: Secondary | ICD-10-CM | POA: Diagnosis not present

## 2023-12-23 DIAGNOSIS — R310 Gross hematuria: Secondary | ICD-10-CM | POA: Diagnosis not present

## 2023-12-31 DIAGNOSIS — Z79899 Other long term (current) drug therapy: Secondary | ICD-10-CM | POA: Diagnosis not present

## 2023-12-31 DIAGNOSIS — M25571 Pain in right ankle and joints of right foot: Secondary | ICD-10-CM | POA: Diagnosis not present

## 2023-12-31 DIAGNOSIS — M159 Polyosteoarthritis, unspecified: Secondary | ICD-10-CM | POA: Diagnosis not present

## 2023-12-31 DIAGNOSIS — M25572 Pain in left ankle and joints of left foot: Secondary | ICD-10-CM | POA: Diagnosis not present

## 2023-12-31 DIAGNOSIS — M545 Low back pain, unspecified: Secondary | ICD-10-CM | POA: Diagnosis not present

## 2023-12-31 DIAGNOSIS — M7918 Myalgia, other site: Secondary | ICD-10-CM | POA: Diagnosis not present

## 2023-12-31 DIAGNOSIS — Z79891 Long term (current) use of opiate analgesic: Secondary | ICD-10-CM | POA: Diagnosis not present

## 2024-01-04 DIAGNOSIS — R310 Gross hematuria: Secondary | ICD-10-CM | POA: Diagnosis not present

## 2024-01-04 DIAGNOSIS — N281 Cyst of kidney, acquired: Secondary | ICD-10-CM | POA: Diagnosis not present

## 2024-01-07 DIAGNOSIS — B351 Tinea unguium: Secondary | ICD-10-CM | POA: Diagnosis not present

## 2024-01-07 DIAGNOSIS — M79672 Pain in left foot: Secondary | ICD-10-CM | POA: Diagnosis not present

## 2024-01-07 DIAGNOSIS — M79671 Pain in right foot: Secondary | ICD-10-CM | POA: Diagnosis not present

## 2024-01-07 DIAGNOSIS — E119 Type 2 diabetes mellitus without complications: Secondary | ICD-10-CM | POA: Diagnosis not present

## 2024-01-27 DIAGNOSIS — J301 Allergic rhinitis due to pollen: Secondary | ICD-10-CM | POA: Diagnosis not present

## 2024-01-27 DIAGNOSIS — M170 Bilateral primary osteoarthritis of knee: Secondary | ICD-10-CM | POA: Diagnosis not present

## 2024-01-27 DIAGNOSIS — E1129 Type 2 diabetes mellitus with other diabetic kidney complication: Secondary | ICD-10-CM | POA: Diagnosis not present

## 2024-01-27 DIAGNOSIS — J449 Chronic obstructive pulmonary disease, unspecified: Secondary | ICD-10-CM | POA: Diagnosis not present

## 2024-01-27 DIAGNOSIS — R809 Proteinuria, unspecified: Secondary | ICD-10-CM | POA: Diagnosis not present

## 2024-01-27 DIAGNOSIS — I10 Essential (primary) hypertension: Secondary | ICD-10-CM | POA: Diagnosis not present

## 2024-01-27 DIAGNOSIS — K219 Gastro-esophageal reflux disease without esophagitis: Secondary | ICD-10-CM | POA: Diagnosis not present

## 2024-01-27 DIAGNOSIS — E559 Vitamin D deficiency, unspecified: Secondary | ICD-10-CM | POA: Diagnosis not present

## 2024-01-27 DIAGNOSIS — J302 Other seasonal allergic rhinitis: Secondary | ICD-10-CM | POA: Diagnosis not present

## 2024-01-27 DIAGNOSIS — E782 Mixed hyperlipidemia: Secondary | ICD-10-CM | POA: Diagnosis not present

## 2024-01-29 DIAGNOSIS — M545 Low back pain, unspecified: Secondary | ICD-10-CM | POA: Diagnosis not present

## 2024-01-29 DIAGNOSIS — M159 Polyosteoarthritis, unspecified: Secondary | ICD-10-CM | POA: Diagnosis not present

## 2024-01-29 DIAGNOSIS — Z79891 Long term (current) use of opiate analgesic: Secondary | ICD-10-CM | POA: Diagnosis not present

## 2024-01-29 DIAGNOSIS — M25571 Pain in right ankle and joints of right foot: Secondary | ICD-10-CM | POA: Diagnosis not present

## 2024-01-29 DIAGNOSIS — Z79899 Other long term (current) drug therapy: Secondary | ICD-10-CM | POA: Diagnosis not present

## 2024-01-29 DIAGNOSIS — M7918 Myalgia, other site: Secondary | ICD-10-CM | POA: Diagnosis not present

## 2024-01-29 DIAGNOSIS — M25572 Pain in left ankle and joints of left foot: Secondary | ICD-10-CM | POA: Diagnosis not present

## 2024-02-18 DIAGNOSIS — M159 Polyosteoarthritis, unspecified: Secondary | ICD-10-CM | POA: Diagnosis not present

## 2024-02-18 DIAGNOSIS — Z79891 Long term (current) use of opiate analgesic: Secondary | ICD-10-CM | POA: Diagnosis not present

## 2024-02-18 DIAGNOSIS — M545 Low back pain, unspecified: Secondary | ICD-10-CM | POA: Diagnosis not present

## 2024-02-18 DIAGNOSIS — G8929 Other chronic pain: Secondary | ICD-10-CM | POA: Diagnosis not present

## 2024-02-18 DIAGNOSIS — M25572 Pain in left ankle and joints of left foot: Secondary | ICD-10-CM | POA: Diagnosis not present

## 2024-02-18 DIAGNOSIS — Z79899 Other long term (current) drug therapy: Secondary | ICD-10-CM | POA: Diagnosis not present

## 2024-02-18 DIAGNOSIS — M7918 Myalgia, other site: Secondary | ICD-10-CM | POA: Diagnosis not present

## 2024-02-18 DIAGNOSIS — M25571 Pain in right ankle and joints of right foot: Secondary | ICD-10-CM | POA: Diagnosis not present

## 2024-02-24 DIAGNOSIS — M170 Bilateral primary osteoarthritis of knee: Secondary | ICD-10-CM | POA: Diagnosis not present

## 2024-02-24 DIAGNOSIS — E782 Mixed hyperlipidemia: Secondary | ICD-10-CM | POA: Diagnosis not present

## 2024-02-24 DIAGNOSIS — I10 Essential (primary) hypertension: Secondary | ICD-10-CM | POA: Diagnosis not present

## 2024-02-24 DIAGNOSIS — J449 Chronic obstructive pulmonary disease, unspecified: Secondary | ICD-10-CM | POA: Diagnosis not present

## 2024-02-24 DIAGNOSIS — E1129 Type 2 diabetes mellitus with other diabetic kidney complication: Secondary | ICD-10-CM | POA: Diagnosis not present

## 2024-02-24 DIAGNOSIS — R809 Proteinuria, unspecified: Secondary | ICD-10-CM | POA: Diagnosis not present

## 2024-02-24 DIAGNOSIS — Z23 Encounter for immunization: Secondary | ICD-10-CM | POA: Diagnosis not present

## 2024-02-24 DIAGNOSIS — J302 Other seasonal allergic rhinitis: Secondary | ICD-10-CM | POA: Diagnosis not present

## 2024-02-24 DIAGNOSIS — Z0001 Encounter for general adult medical examination with abnormal findings: Secondary | ICD-10-CM | POA: Diagnosis not present

## 2024-02-24 DIAGNOSIS — J301 Allergic rhinitis due to pollen: Secondary | ICD-10-CM | POA: Diagnosis not present

## 2024-02-24 DIAGNOSIS — E559 Vitamin D deficiency, unspecified: Secondary | ICD-10-CM | POA: Diagnosis not present

## 2024-02-24 DIAGNOSIS — K219 Gastro-esophageal reflux disease without esophagitis: Secondary | ICD-10-CM | POA: Diagnosis not present

## 2024-03-18 DIAGNOSIS — Z79899 Other long term (current) drug therapy: Secondary | ICD-10-CM | POA: Diagnosis not present

## 2024-03-18 DIAGNOSIS — M545 Low back pain, unspecified: Secondary | ICD-10-CM | POA: Diagnosis not present

## 2024-03-18 DIAGNOSIS — M159 Polyosteoarthritis, unspecified: Secondary | ICD-10-CM | POA: Diagnosis not present

## 2024-03-18 DIAGNOSIS — M7918 Myalgia, other site: Secondary | ICD-10-CM | POA: Diagnosis not present

## 2024-03-18 DIAGNOSIS — M25572 Pain in left ankle and joints of left foot: Secondary | ICD-10-CM | POA: Diagnosis not present

## 2024-03-18 DIAGNOSIS — Z79891 Long term (current) use of opiate analgesic: Secondary | ICD-10-CM | POA: Diagnosis not present

## 2024-03-18 DIAGNOSIS — M25571 Pain in right ankle and joints of right foot: Secondary | ICD-10-CM | POA: Diagnosis not present

## 2024-03-18 DIAGNOSIS — G8929 Other chronic pain: Secondary | ICD-10-CM | POA: Diagnosis not present

## 2024-03-27 DIAGNOSIS — R310 Gross hematuria: Secondary | ICD-10-CM | POA: Diagnosis not present
